# Patient Record
Sex: Male | Born: 1980
Health system: Southern US, Community
[De-identification: ages and names within clinical notes are randomized; demographics above are authoritative.]

## PROBLEM LIST (undated history)

## (undated) DIAGNOSIS — M7712 Lateral epicondylitis, left elbow: Secondary | ICD-10-CM

## (undated) DIAGNOSIS — K219 Gastro-esophageal reflux disease without esophagitis: Secondary | ICD-10-CM

## (undated) HISTORY — DX: Lateral epicondylitis, left elbow: M77.12

## (undated) HISTORY — DX: Gastro-esophageal reflux disease without esophagitis: K21.9

---

## 2018-05-23 ENCOUNTER — Ambulatory Visit: Payer: Self-pay | Admitting: Sports Medicine

## 2018-05-25 ENCOUNTER — Ambulatory Visit: Payer: BLUE CROSS/BLUE SHIELD | Admitting: Sports Medicine

## 2018-05-25 ENCOUNTER — Ambulatory Visit: Payer: Self-pay

## 2018-05-25 VITALS — BP 122/78 | Ht 75.0 in | Wt 195.0 lb

## 2018-05-25 DIAGNOSIS — M7712 Lateral epicondylitis, left elbow: Secondary | ICD-10-CM | POA: Diagnosis not present

## 2018-05-25 DIAGNOSIS — M25522 Pain in left elbow: Secondary | ICD-10-CM

## 2018-05-26 ENCOUNTER — Encounter: Payer: Self-pay | Admitting: Sports Medicine

## 2018-05-26 NOTE — Progress Notes (Signed)
   Subjective:    Patient ID: Richard Campos, male    DOB: [birth year], 38 y.o.   MRN: [id number]  HPI chief complaint: Left elbow pain  Very pleasant 38 year old right-hand-dominant male comes in today complaining of left elbow pain that started several months ago.  He is very active and enjoys lifting weights and playing tennis.  He believes his pain began while utilizing a two-handed backhand in tennis.  He does not recall any specific injury but began to develop lateral elbow pain that eventually developed into more diffuse pain involving the entire elbow.  At that time, he was living in [place] and he saw his PCP there.  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  This actually made his pain worse.  He return to his PCP for a cortisone injection which was minimally helpful.  Since that time, he has relocated to [place].  He has returned to lifting weights and has actually noticed his elbow pain starting to improve.  Today he localizes all of his pain to the lateral elbow.  He has not noticed any swelling.  He denies numbness or tingling.  No prior elbow surgeries.  No imaging to date.  Past medical history reviewed Medications reviewed Allergies reviewed Works as an Pensions consultant   Review of Systems    As above Objective:   Physical Exam  Well-developed, well-nourished.  No acute distress.  Awake alert and oriented x3.  Vital signs reviewed.  Left elbow: Full range of motion.  No effusion.  No soft tissue swelling.  Patient is tender to palpation over the lateral epicondyle with reproducible pain with ECRB testing.  No tenderness over the medial epicondyle.  No tenderness over the biceps tendon.  Good strength.  Good pulses.   Left elbow MSK ultrasound: Limited images were obtained.  Longitudinal view shows a hypoechoic area in the deep fibers in the midportion of the common extensor tendon.  No joint effusion.  No spurring appreciated.   Findings consistent with common extensor tendinopathy with small partial thickness tear of the deep fibers.      Assessment & Plan:   Left elbow pain secondary to common extensor tendinopathy/partial tearing  Patient is given a home exercise program consisting of stretching and eccentric strengthening exercises.  Since his symptoms are already starting to improve on their own I am going to hold off on formal physical therapy at this time.  However, patient will return to the office in 4 weeks for reevaluation.  If symptoms do not continue to improve then I would reconsider merits of formal physical therapy.  I did explain that we do not like to repeat cortisone injections for this condition.  Future options could include PRP versus surgical consultation for common extensor tendon debridement.  This would require a preoperative MRI. Of note, patient is an avid Armed forces operational officer but I would like for him to refrain from tennis until follow-up.  He may continue to lift weights using pain as his guide.  As he is new to the area and enjoys tennis I will be sure to introduce him to Dr.Fields at his follow-up visit.

## 2018-06-21 ENCOUNTER — Ambulatory Visit: Payer: BLUE CROSS/BLUE SHIELD | Admitting: Sports Medicine

## 2018-06-21 VITALS — BP 104/68 | Ht 75.0 in | Wt 195.0 lb

## 2018-06-21 DIAGNOSIS — M7712 Lateral epicondylitis, left elbow: Secondary | ICD-10-CM

## 2018-06-22 NOTE — Progress Notes (Signed)
   Subjective:    Patient ID: Richard Campos, male    DOB: 09/23/1980, 38 y.o.   MRN: [id number]  HPI   Patient comes in today for follow-up on left elbow lateral epicondylitis.  Unfortunately, he is still struggling with pain despite a home exercise program.  He has not returned to playing tennis.  He still localizes pain directly over the lateral epicondyle.  Most noticeable with lifting heavy objects with his left hand.  He has not noticed any swelling.  No numbness or tingling.  MSK ultrasound done at the last visit showed hypoechoic changes of the deep fibers of the midportion of the common extensor tendon concerning for possible partial-thickness tear here.    Review of Systems    As above Objective:   Physical Exam  Well-developed, well-nourished.  No acute distress.  Awake alert and oriented x3.  Vital signs reviewed  Left elbow: Full range of motion.  No effusion.  No soft tissue swelling.  Patient remains tender to palpation directly over the lateral epicondyle with reproducible pain with ECRB testing.  No tenderness over the medial epicondyle.  No tenderness over the biceps tendon.  Good strength.  Good pulses.       Assessment & Plan:   Chronic left elbow pain secondary to lateral epicondylitis with ultrasound evidence of probable partial-thickness tear  Patient has had symptoms for several months that have been unresponsive to conservative treatment including oral prednisone, steroid injections, and a home exercise program.  I would like to get an MRI to further evaluate the degree of tearing of the common extensor tendon.  Patient may be a candidate for surgical debridement or repair.  Patient is in agreement with this plan.  He would also like to go ahead and start formal physical therapy while we wait on the MRI to get done.  Phone follow-up with MRI results when available.  We will delineate further treatment based on those findings.

## 2018-06-24 ENCOUNTER — Ambulatory Visit
Admission: RE | Admit: 2018-06-24 | Discharge: 2018-06-24 | Disposition: A | Discharge status: Home / Self Care | Payer: BLUE CROSS/BLUE SHIELD | Source: Ambulatory Visit | Attending: Sports Medicine | Admitting: Sports Medicine

## 2018-06-24 DIAGNOSIS — M7712 Lateral epicondylitis, left elbow: Secondary | ICD-10-CM | POA: Diagnosis not present

## 2018-07-01 ENCOUNTER — Ambulatory Visit
Admission: RE | Admit: 2018-07-01 | Discharge: 2018-07-01 | Disposition: A | Discharge status: Home / Self Care | Payer: BLUE CROSS/BLUE SHIELD | Source: Ambulatory Visit | Attending: Sports Medicine | Admitting: Sports Medicine

## 2018-07-01 DIAGNOSIS — S56512A Strain of other extensor muscle, fascia and tendon at forearm level, left arm, initial encounter: Secondary | ICD-10-CM | POA: Diagnosis not present

## 2018-07-01 DIAGNOSIS — M7712 Lateral epicondylitis, left elbow: Secondary | ICD-10-CM

## 2018-07-06 ENCOUNTER — Telehealth: Payer: Self-pay | Admitting: Sports Medicine

## 2018-07-06 NOTE — Telephone Encounter (Signed)
  I spoke with Richard Campos on the phone today after reviewing MRI findings of his left elbow.  MRI confirms what was seen on ultrasound.  He has moderate tendinosis of the common extensor tendon origin with a small interstitial tear. No other abnormalities.  Based on these findings, I recommended that he go ahead and start physical therapy and follow-up with me 4 weeks later.  I reassured him that this typically improves with time but if he continues to struggle after physical therapy then we could consider referral to Dr. Amanda Pea for his input.  Patient is in agreement with this plan.

## 2018-07-13 ENCOUNTER — Ambulatory Visit: Payer: BLUE CROSS/BLUE SHIELD | Attending: Sports Medicine | Admitting: Physical Therapy

## 2018-07-13 ENCOUNTER — Encounter: Payer: Self-pay | Admitting: Physical Therapy

## 2018-07-13 ENCOUNTER — Other Ambulatory Visit: Payer: Self-pay

## 2018-07-13 DIAGNOSIS — M25522 Pain in left elbow: Secondary | ICD-10-CM | POA: Insufficient documentation

## 2018-07-13 DIAGNOSIS — M6281 Muscle weakness (generalized): Secondary | ICD-10-CM | POA: Diagnosis not present

## 2018-07-13 DIAGNOSIS — M25622 Stiffness of left elbow, not elsewhere classified: Secondary | ICD-10-CM | POA: Diagnosis not present

## 2018-07-13 DIAGNOSIS — M62838 Other muscle spasm: Secondary | ICD-10-CM

## 2018-07-13 NOTE — Patient Instructions (Addendum)

## 2018-07-13 NOTE — Addendum Note (Signed)
Addended by: [name] on: 07/13/2018 09:55 AM   Modules accepted: Orders

## 2018-07-13 NOTE — Therapy (Signed)
[facility] [address]  [address] [place], [place], [address] Phone: [phone]   Fax:  [phone]  Physical Therapy Evaluation  Patient Details  Name: Richard Campos MRN: [id number] Date of Birth: 1980/06/28 Referring Provider (PT): Reino Bellis, MD   Encounter Date: 07/13/2018  PT End of Session - 07/13/18 0918    Visit Number  1    Number of Visits  8    Date for PT Re-Evaluation  08/10/18    Authorization Type  BCBS    PT Start Time  0805    PT Stop Time  0851    PT Time Calculation (min)  46 min    Activity Tolerance  Patient tolerated treatment well    Behavior During Therapy  [facility] for tasks assessed/performed       History reviewed. No pertinent past medical history.  History reviewed. No pertinent surgical history.  There were no vitals filed for this visit.   Subjective Assessment - 07/13/18 0808    Subjective  Pt reports started noting issues with elbow late summer (~Sept 2019). PCP recommended rest (stop working out & playing tennis), but pain got worse. Ended up with injections with minimal relief. After moving to [place] tried goind back to gym with some improvement, but continued to avoid tennis. Saw ortho MD and was presctribed HEP, but continues to have lingereing issues. [place] & MRI reveal small tear.    Patient Stated Goals  "get back on the tennis court w/o hurting"    Currently in Pain?  No/denies    Pain Score  0-No pain   up to 3-4/10 typically with certain movements   Pain Location  Elbow    Pain Orientation  Left;Lateral    Pain Descriptors / Indicators  Sharp;Tightness    Pain Type  Chronic pain    Pain Radiating Towards  n/a    Pain Onset  More than a month ago    Pain Frequency  Intermittent    Aggravating Factors   wrist extension, resisted middle finger extension, wide grip    Pain Relieving Factors  stretching, minimal pain relief from icing    Effect of Pain on Daily Activities  limits  lifting and carrying things in L hand         [facility] PT Assessment - 07/13/18 0805      Assessment   Medical Diagnosis  L lateral epicondylitis    Referring Provider (PT)  Reino Bellis, MD    Onset Date/Surgical Date  --   ~Sept 2019   Hand Dominance  Right    Next MD Visit  PRN    Prior Therapy  remote h/o PT of Achilles tendonitis      Home Environment   Living Environment  Private residence      Prior Function   Level of Independence  Independent    Vocation  Full time employment    Vocation Requirements  desk job    Leisure  working out - lifting, running, stationary bike; tennis      Cognition   Overall Cognitive Status  Within Functional Limits for tasks assessed      Observation/Other Assessments   Focus on Therapeutic Outcomes (FOTO)   Elbow - 63% (37% limitation); Predicted 73% (27% limitation)      ROM / Strength   AROM / PROM / Strength  AROM;Strength      AROM   Overall AROM   Within functional limits for tasks  performed    AROM Assessment Site  Elbow;Forearm;Wrist      Strength   Strength Assessment Site  Elbow;Forearm;Wrist;Hand    Right/Left Elbow  Right;Left    Right Elbow Flexion  5/5    Right Elbow Extension  5/5    Left Elbow Flexion  4+/5    Left Elbow Extension  4+/5    Right/Left Forearm  Right;Left    Right Forearm Pronation  5/5    Right Forearm Supination  5/5    Left Forearm Pronation  4+/5   slight pain   Left Forearm Supination  4+/5   slight pain   Right/Left Wrist  Right;Left    Right Wrist Flexion  5/5    Right Wrist Extension  5/5    Right Wrist Radial Deviation  5/5    Right Wrist Ulnar Deviation  5/5    Left Wrist Flexion  4+/5   slight pain   Left Wrist Extension  4+/5    Left Wrist Radial Deviation  4+/5    Left Wrist Ulnar Deviation  4+/5   slight discomfort   Right/Left hand  Right;Left    Right Hand Gross Grasp  Functional    Right Hand Grip (lbs)  92.67   89, 94, 95   Right Hand Lateral Pinch  20.33 lbs   21,  20, 20   Right Hand 3 Point Pinch  17 lbs   16, 17, 18   Left Hand Grip (lbs)  70   79, 71, 60   Left Hand Lateral Pinch  17 lbs   17, 17, 17   Left Hand 3 Point Pinch  15.67 lbs   15, 15, 16     Palpation   Palpation comment  taut bands t/o L wrist extensor group with mild ttp                Objective measurements completed on examination: See above findings.      [facility] Adult PT Treatment/Exercise - 07/13/18 0805      Self-Care   Self-Care  Heat/Ice Application    Heat/Ice Application  Instructed pt in ice massage for lateral epicondylitis.      Exercises   Exercises  Elbow;Wrist      Elbow Exercises   Forearm Supination  Left;5 reps;Seated    Forearm Supination Limitations  hammer (held at end) - focus on slow eccentric control    Forearm Pronation  Left;5 reps;Seated    Forearm Pronation Limitations  hammer (held at end) - focus on slow eccentric control      Wrist Exercises   Wrist Flexion  Left;10 reps;Bar weights/barbell;Strengthening;Seated    Bar Weights/Barbell (Wrist Flexion)  5 lbs    Wrist Extension  Left;10 reps;Bar weights/barbell;Strengthening;Seated    Bar Weights/Barbell (Wrist Extension)  5 lbs    Wrist Extension Limitations  AAROM concentric with focus on slow eccentric control    Other wrist exercises  L wrist flexion stretch x 30 sec    Other wrist exercises  L wrist exetnsion stretch with slight ulnar deviation x 30 sec             PT Education - 07/13/18 0850    Education Details  PT eval findings, anticipated POC, review of MD prescribed HEP with minor modifications (esp emphasis on eccentric wrist extension), instructions in ice massage & info on dry needling    Person(s) Educated  Patient    Methods  Explanation;Demonstration;Handout    Comprehension  Verbalized understanding;Returned demonstration;Need  further instruction          PT Long Term Goals - 07/13/18 0851      PT LONG TERM GOAL #1   Title  Independent with  ongoing/advanced HEP    Status  New    Target Date  08/10/18      PT LONG TERM GOAL #2   Title  L grip strength within 10# of R w/o increased pain    Status  New    Target Date  08/10/18      PT LONG TERM GOAL #3   Title  Patient will report ability to use L arm functionally with daily ADLs and task including lifting & carrying items w/o pain provocation    Status  New    Target Date  08/10/18      PT LONG TERM GOAL #4   Title  Patient will report ability to return to playing tennis w/o increased pain at L elbow    Status  New    Target Date  08/10/18             Plan - 07/13/18 0851    Clinical Impression Statement  Karlito is a 38 y/o R handed male who presents to OP PT for L lateral epicondylitis originating ~6 months ago in late summer 2019. Pt attributes onset of pain to playing tennis with 2 handed swing. Pain currently 0/10 at rest, but pt reports pain up to 3-4/10 with activities requiring wrist extension or wide handed grip and lifting. Pt mildly ttp over L lateral epicondyle and wrist extensor muscle group, with taut bands palpable t/o L wrist extensor group. L wrist and elbow ROM essentially WNL with mild tightness reported at end ranges with most motions of L forearm and wrist. Very mild weakness noted in L elbow, forearm and wrist with >20# grip strength deficit L vs R. Pain limits lifting and carrying things in L hand and keeps him from returning to playing tennis. Pt will benefit from skilled PT to address deficits listed to reduce pain and allow for improved functional L UE use with daily activities as well as allow him to return to playing tennis.    Clinical Presentation  Stable    Clinical Decision Making  Low    Rehab Potential  Good    PT Frequency  2x / week    PT Duration  4 weeks    PT Treatment/Interventions  Patient/family education;Therapeutic exercise;Therapeutic activities;Manual techniques;Dry needling;Passive range of motion;Taping;Ultrasound;Electrical  Stimulation;Moist Heat;Cryotherapy;Iontophoresis 4mg /ml Dexamethasone;ADLs/Self Care Home Management    Consulted and Agree with Plan of Care  Patient       Patient will benefit from skilled therapeutic intervention in order to improve the following deficits and impairments:  Pain, Impaired flexibility, Increased muscle spasms, Increased fascial restricitons, Decreased strength, Impaired UE functional use, Decreased activity tolerance  Visit Diagnosis: Pain in left elbow  Stiffness of left elbow, not elsewhere classified  Other muscle spasm  Muscle weakness (generalized)     Problem List There are no active problems to display for this patient.   [name], PT, MPT 07/13/2018, 9:48 AM  [facility] [address]  [address] [place], [place], [address] Phone: [phone]   Fax:  [phone]  Name: Richard Campos MRN: [id number] Date of Birth: October 25, 1980

## 2018-07-19 ENCOUNTER — Ambulatory Visit: Payer: BLUE CROSS/BLUE SHIELD | Attending: Sports Medicine | Admitting: Physical Therapy

## 2018-07-19 ENCOUNTER — Encounter: Payer: Self-pay | Admitting: Physical Therapy

## 2018-07-19 DIAGNOSIS — M62838 Other muscle spasm: Secondary | ICD-10-CM | POA: Diagnosis not present

## 2018-07-19 DIAGNOSIS — M25622 Stiffness of left elbow, not elsewhere classified: Secondary | ICD-10-CM

## 2018-07-19 DIAGNOSIS — M25522 Pain in left elbow: Secondary | ICD-10-CM | POA: Insufficient documentation

## 2018-07-19 DIAGNOSIS — M6281 Muscle weakness (generalized): Secondary | ICD-10-CM | POA: Insufficient documentation

## 2018-07-19 NOTE — Therapy (Signed)
[facility] [address]  [address] [place], [place], [address] Phone: [phone]   Fax:  [phone]  Physical Therapy Treatment  Patient Details  Name: Richard Campos MRN: [id number] Date of Birth: 1980-11-01 Referring Provider (PT): Reino Bellis, MD   Encounter Date: 07/19/2018  PT End of Session - 07/19/18 1703    Visit Number  2    Number of Visits  8    Date for PT Re-Evaluation  08/10/18    Authorization Type  BCBS    PT Start Time  1703    PT Stop Time  1745    PT Time Calculation (min)  42 min    Activity Tolerance  Patient tolerated treatment well    Behavior During Therapy  [facility] for tasks assessed/performed       History reviewed. No pertinent past medical history.  History reviewed. No pertinent surgical history.  There were no vitals filed for this visit.  Subjective Assessment - 07/19/18 1706    Subjective  Pt reports has been completing HEP as recommended at eval and feels changes have made a definite difference in the feel of the exercises - feels like exercises are better challenging him and he has dropped back on the weights. Also performing ice massage after completing HEP.    Patient Stated Goals  "get back on the tennis court w/o hurting"    Currently in Pain?  No/denies    Pain Onset  More than a month ago                       [facility] Adult PT Treatment/Exercise - 07/19/18 1703      Self-Care   Self-Care  Posture    Posture  Discussed proper keyboard height and wrist support for computer ues at work.      Exercises   Exercises  Elbow;Wrist      Shoulder Exercises: ROM/Strengthening   UBE (Upper Arm Bike)  L2.5 x 6 min (3' fwd/3' back)      Wrist Exercises   Other wrist exercises  B wrist extension roll up with red TB on PVC pipe x 3     Other wrist exercises  L wrist extension stretch with slight ulnar deviation x 30 sec      Modalities   Modalities  Iontophoresis      Iontophoresis   Type of Iontophoresis  Dexamethasone    Location  L lateral epicondyle/wrist extensor group    Dose  80 mA-min, 1.0 mL    Time  4-6 hr patch (#1 of 6)      Manual Therapy   Manual Therapy  Soft tissue mobilization;Myofascial release    Soft tissue mobilization  STM & cross-friction massage to L wrist extensor group    Myofascial Release  pin & stretch to L wrist extensors       Trigger Point Dry Needling - 07/19/18 1703    Consent Given?  Yes    Education Handout Provided  Yes    Muscles Treated Wrist/Hand  Extensor carpi radialis longus/brevis;Extensor digitorum   Left   Extensor carpi radialis longus/brevis Response  Twitch response elicited;Palpable increased muscle length    Extensor digitorum Response  Twitch response elicited;Palpable increased muscle length           PT Education - 07/19/18 1703    Education Details  Review of role of DN, expected response to treatment and post-treatment recommended activity level; Iontophoresis  patch instructions    Person(s) Educated  Patient    Methods  Explanation;Handout    Comprehension  Verbalized understanding          PT Long Term Goals - 07/19/18 1706      PT LONG TERM GOAL #1   Title  Independent with ongoing/advanced HEP    Status  On-going      PT LONG TERM GOAL #2   Title  L grip strength within 10# of R w/o increased pain    Status  On-going      PT LONG TERM GOAL #3   Title  Patient will report ability to use L arm functionally with daily ADLs and task including lifting & carrying items w/o pain provocation    Status  On-going      PT LONG TERM GOAL #4   Title  Patient will report ability to return to playing tennis w/o increased pain at L elbow    Status  On-going            Plan - 07/19/18 1706    Clinical Impression Statement  Loistine Chance reporting recommended adjustments to HEP from eval (slow pace with focus on eccentric control) are making the exercises more beneficial. Also notes  he has been following the exercises with the ice massage to help with inflammation and ongoing pain. Initiated manual therapy including DN upon informed pt consent with good twitch responses elicited and palpable reduction in muscle tension and taut bands. Inital ionto patch also applied to promote further reduction in inflammation & irritation.    Rehab Potential  Good    PT Treatment/Interventions  Patient/family education;Therapeutic exercise;Therapeutic activities;Manual techniques;Dry needling;Passive range of motion;Taping;Ultrasound;Electrical Stimulation;Moist Heat;Cryotherapy;Iontophoresis 4mg /ml Dexamethasone;ADLs/Self Care Home Management    PT Next Visit Plan  Assess response to DN & ionto patch; Manual therapy including DN as indicated to L wrist extensor group; Flexibility and eccentirc focused strengthening; Modalities including ionto, [place], estim or ice massage as indicated    Consulted and Agree with Plan of Care  Patient       Patient will benefit from skilled therapeutic intervention in order to improve the following deficits and impairments:  Pain, Impaired flexibility, Increased muscle spasms, Increased fascial restricitons, Decreased strength, Impaired UE functional use, Decreased activity tolerance  Visit Diagnosis: Pain in left elbow  Stiffness of left elbow, not elsewhere classified  Other muscle spasm  Muscle weakness (generalized)     Problem List There are no active problems to display for this patient.   Richard Campos, PT, MPT 07/19/2018, 6:20 PM  [facility] [address]  [address] [place], [place], [address] Phone: [phone]   Fax:  [phone]  Name: Richard Campos MRN: [id number] Date of Birth: [birth year]

## 2018-07-19 NOTE — Patient Instructions (Signed)
IONTOPHORESIS PATIENT PRECAUTIONS & CONTRAINDICATIONS:  . Redness under one or both electrodes can occur.  This characterized by a uniform redness that usually disappears within 12 hours of treatment. . Small pinhead size blisters may result in response to the drug.  Contact your physician if the problem persists more than 24 hours. . On rare occasions, iontophoresis therapy can result in temporary skin reactions such as rash, inflammation, irritation or burns.  The skin reactions may be the result of individual sensitivity to the ionic solution used, the condition of the skin at the start of treatment, reaction to the materials in the electrodes, allergies or sensitivity to dexamethasone, or a poor connection between the patch and your skin.  Discontinue using iontophoresis if you have any of these reactions and report to your therapist. . Remove the Patch or electrodes if you have any undue sensation of pain or burning during the treatment and report discomfort to your therapist. . Tell your Therapist if you have had known adverse reactions to the application of electrical current. . Approximate treatment time is 4-6 hours.  Remove the patch after 6 hours. . The Patch can be worn during normal activity, however excessive motion where the electrodes have been placed can cause poor contact between the skin and the electrode or uneven electrical current resulting in greater risk of skin irritation. . Keep out of the reach of children.   . DO NOT use if you have a cardiac pacemaker or any other electrically sensitive implanted device. . DO NOT use if you have a known sensitivity to dexamethasone. . DO NOT use during Magnetic Resonance Imaging (MRI). . DO NOT use over broken or compromised skin (e.g. sunburn, cuts, or acne) due to the increased risk of skin reaction. . DO NOT SHAVE over the area to be treated:  To establish good contact between the Patch and the skin, excessive hair may be  clipped. . DO NOT place the Patch or electrodes on or over your eyes, directly over your heart, or brain. . DO NOT reuse the Patch or electrodes as this may cause burns to occur.  

## 2018-07-21 ENCOUNTER — Ambulatory Visit: Payer: BLUE CROSS/BLUE SHIELD

## 2018-07-21 DIAGNOSIS — M25522 Pain in left elbow: Secondary | ICD-10-CM

## 2018-07-21 DIAGNOSIS — M25622 Stiffness of left elbow, not elsewhere classified: Secondary | ICD-10-CM | POA: Diagnosis not present

## 2018-07-21 DIAGNOSIS — M62838 Other muscle spasm: Secondary | ICD-10-CM | POA: Diagnosis not present

## 2018-07-21 DIAGNOSIS — M6281 Muscle weakness (generalized): Secondary | ICD-10-CM

## 2018-07-21 NOTE — Therapy (Signed)
[facility] [address]  [address] [place], [place], [address] Phone: [phone]   Fax:  [phone]  Physical Therapy Treatment  Patient Details  Name: Richard Campos MRN: [id number] Date of Birth: 07/24/1980 Referring Provider (PT): Reino Bellis, MD   Encounter Date: 07/21/2018  PT End of Session - 07/21/18 0855    Visit Number  3    Number of Visits  8    Date for PT Re-Evaluation  08/10/18    Authorization Type  BCBS    PT Start Time  0848    PT Stop Time  0934    PT Time Calculation (min)  46 min    Activity Tolerance  Patient tolerated treatment well    Behavior During Therapy  [facility] for tasks assessed/performed       No past medical history on file.  No past surgical history on file.  There were no vitals filed for this visit.  Subjective Assessment - 07/21/18 0852    Subjective  Pt. reporting he had some dull soreness after dry needling last session however this may be also attributes to hitting his L elbow on door yesterday.      Patient Stated Goals  "get back on the tennis court w/o hurting"    Currently in Pain?  No/denies    Pain Score  0-No pain   up to 5/10 with pressure in L elbow   Pain Location  Elbow    Pain Orientation  Left;Lateral    Pain Descriptors / Indicators  Sore;Tightness    Pain Type  Chronic pain    Pain Onset  More than a month ago    Pain Frequency  Intermittent    Multiple Pain Sites  No                       [facility] Adult PT Treatment/Exercise - 07/21/18 0908      Shoulder Exercises: ROM/Strengthening   UBE (Upper Arm Bike)  L2.5 x 6 min (3' fwd/3' back)      Hand Exercises   Other Hand Exercises  L F990 grip squeeze yellow spring (#20) 2 x 15 reps; focusing on 3" eccentric release       Wrist Exercises   Wrist Flexion  Left;Strengthening;Seated;Bar weights/barbell   2 x 12 reps    Bar Weights/Barbell (Wrist Flexion)  5 lbs    Wrist Flexion Limitations   AAROM concentric with focus on slow eccentric     Wrist Extension  Left;10 reps;Strengthening;Seated;Bar weights/barbell   2 x 10 reps    Bar Weights/Barbell (Wrist Extension)  5 lbs    Wrist Extension Limitations  AAROM concentric with focus on slow eccentric     Other wrist exercises  L wrist extension stretch with slight ulnar deviation 3 x 30 sec      Modalities   Modalities  Ultrasound      Ultrasound   Ultrasound Location  L forearm extensor bundle     Ultrasound Parameters  3.3MHz, 0.8 Watts/cm2, 8'    Ultrasound Goals  Pain      Manual Therapy   Manual Therapy  Soft tissue mobilization;Myofascial release    Manual therapy comments  supine     Soft tissue mobilization  STM & cross-friction massage to L wrist extensor group   avoiding reported bruised area from pt. hitting arm on door             PT Education -  07/21/18 1004    Education Details  HEP update; eccentric focused wrist extension, flexion curl with dumbbell 5# at home, wrist extension stretch with ulnar deviation    Person(s) Educated  Patient    Methods  Explanation;Verbal cues;Handout;Demonstration    Comprehension  Verbalized understanding;Returned demonstration;Verbal cues required;Need further instruction          PT Long Term Goals - 07/19/18 1706      PT LONG TERM GOAL #1   Title  Independent with ongoing/advanced HEP    Status  On-going      PT LONG TERM GOAL #2   Title  L grip strength within 10# of R w/o increased pain    Status  On-going      PT LONG TERM GOAL #3   Title  Patient will report ability to use L arm functionally with daily ADLs and task including lifting & carrying items w/o pain provocation    Status  On-going      PT LONG TERM GOAL #4   Title  Patient will report ability to return to playing tennis w/o increased pain at L elbow    Status  On-going            Plan - 07/21/18 0858    Clinical Impression Statement  Pt. reporting he has had L elbow soreness  after DN last session however also may be attributed to hitting his L elbow on door yesterday.  Unsure of response to ionto patch applied last session.  Session focusing on progression of wrist flexion/extension eccentric strengthening activities with addition of grip strengthening which was well tolerated without pain increase.  Pt. reporting L elbow only hurting with pressure today.  Trialed ultrasound at 50% pulsed to L forearm extensor bundle today which was tolerated well.  Pt. reporting he is performing ice cup massage to L elbow nightly.  Will monitor responses to today's session in upcoming visit and continue to progress.      Rehab Potential  Good    PT Treatment/Interventions  Patient/family education;Therapeutic exercise;Therapeutic activities;Manual techniques;Dry needling;Passive range of motion;Taping;Ultrasound;Electrical Stimulation;Moist Heat;Cryotherapy;Iontophoresis 4mg /ml Dexamethasone;ADLs/Self Care Home Management    PT Next Visit Plan  Monitor response to eccentric strengthening progression and ultrasound; Manual therapy including DN as indicated to L wrist extensor group; Flexibility and eccentirc focused strengthening; Modalities including ionto, [place], estim or ice massage as indicated    Consulted and Agree with Plan of Care  Patient       Patient will benefit from skilled therapeutic intervention in order to improve the following deficits and impairments:  Pain, Impaired flexibility, Increased muscle spasms, Increased fascial restricitons, Decreased strength, Impaired UE functional use, Decreased activity tolerance  Visit Diagnosis: Pain in left elbow  Stiffness of left elbow, not elsewhere classified  Other muscle spasm  Muscle weakness (generalized)     Problem List There are no active problems to display for this patient.   Richard Campos, PTA 07/21/18 10:05 AM   [facility] [address]  [address] [place], [place], [address] Phone: [phone]   Fax:  [phone]  Name: Richard Campos MRN: [id number] Date of Birth: 21-Dec-1980

## 2018-07-25 ENCOUNTER — Encounter: Payer: Self-pay | Admitting: Physical Therapy

## 2018-07-25 ENCOUNTER — Ambulatory Visit: Payer: BLUE CROSS/BLUE SHIELD | Admitting: Physical Therapy

## 2018-07-25 DIAGNOSIS — M6281 Muscle weakness (generalized): Secondary | ICD-10-CM

## 2018-07-25 DIAGNOSIS — M62838 Other muscle spasm: Secondary | ICD-10-CM | POA: Diagnosis not present

## 2018-07-25 DIAGNOSIS — M25522 Pain in left elbow: Secondary | ICD-10-CM | POA: Diagnosis not present

## 2018-07-25 DIAGNOSIS — M25622 Stiffness of left elbow, not elsewhere classified: Secondary | ICD-10-CM | POA: Diagnosis not present

## 2018-07-25 NOTE — Therapy (Signed)
[facility] [address]  [address] [place], [place], [address] Phone: [phone]   Fax:  [phone]  Physical Therapy Treatment  Patient Details  Name: Richard Campos MRN: [id number] Date of Birth: 1981-01-25 Referring Provider (PT): Reino Bellis, MD   Encounter Date: 07/25/2018  PT End of Session - 07/25/18 1704    Visit Number  4    Number of Visits  8    Date for PT Re-Evaluation  08/10/18    Authorization Type  BCBS    PT Start Time  1704    PT Stop Time  1747    PT Time Calculation (min)  43 min    Activity Tolerance  Patient tolerated treatment well    Behavior During Therapy  [facility] for tasks assessed/performed       History reviewed. No pertinent past medical history.  History reviewed. No pertinent surgical history.  There were no vitals filed for this visit.  Subjective Assessment - 07/25/18 1706    Subjective  Pt still feeling like he is having some bruising related soreness from when he hit his arm against the doorframe last week, but feels that this is different from his intial pain which seems to be improving.    Patient Stated Goals  "get back on the tennis court w/o hurting"    Currently in Pain?  No/denies    Pain Onset  More than a month ago                       [facility] Adult PT Treatment/Exercise - 07/25/18 1704      Elbow Exercises   Forearm Supination  Left;15 reps;Seated;Strengthening    Forearm Supination Limitations  golf club held at distal end of grip    Forearm Pronation  Left;15 reps;Seated;Strengthening    Forearm Pronation Limitations  golf club held at distal end of grip      Shoulder Exercises: Standing   Diagonals  Left;15 reps;Theraband;Strengthening    Theraband Level (Shoulder Diagonals)  Level 3 (Green)    Diagonals Limitations  D1/D2 flexion & extension - emhasizing wrist & forearm motions      Shoulder Exercises: ROM/Strengthening   UBE (Upper Arm Bike)   L3.0 x 6 min (3' fwd/3' back)      Hand Exercises   Digit Composite ABduction  Left;10 reps;Theraband;Strengthening    Digit Composite ABduction Limitations  yellow TB    Other Hand Exercises  L Stovall grip 50# (yellow spring) x15 focusing on slow eccentric release      Wrist Exercises   Other wrist exercises  B wrist extension roll up with green TB on PVC pipe x 3      Manual Therapy   Manual Therapy  Taping    Kinesiotex  Inhibit Muscle      Kinesiotix   Inhibit Muscle   L lateral epicondylitis - 3 strips - 2 strips 30-50% wrist to triceps & forearm to triceps, 70% perpendicular over area of greatest tenderness             PT Education - 07/25/18 1745    Education Details  Kinesotape wearing instructions; Yellow TB provided for composite digit extension/abduction at home    Person(s) Educated  Patient    Methods  Explanation;Demonstration;Handout    Comprehension  Verbalized understanding;Returned demonstration          PT Long Term Goals - 07/19/18 1706      PT LONG  TERM GOAL #1   Title  Independent with ongoing/advanced HEP    Status  On-going      PT LONG TERM GOAL #2   Title  L grip strength within 10# of R w/o increased pain    Status  On-going      PT LONG TERM GOAL #3   Title  Patient will report ability to use L arm functionally with daily ADLs and task including lifting & carrying items w/o pain provocation    Status  On-going      PT LONG TERM GOAL #4   Title  Patient will report ability to return to playing tennis w/o increased pain at L elbow    Status  On-going            Plan - 07/25/18 1709    Clinical Impression Statement  Richard Campos reporting bruising from impact with doorway still causing some soreness but he feels like pain that brought im to PT is improving. Slight faded yellow bruise still evident over wrist extensor group with some ttp but significantly reduced muscle tension/taut bands. Good tolerance for progression of strengthening  exercises including adding multidimensional movements with UE PNF patterns. Initiated trial of taping today to promote decreased bruising and further decrease wrist extensor muscle tension & will assess response on next visit.    Rehab Potential  Good    PT Treatment/Interventions  Patient/family education;Therapeutic exercise;Therapeutic activities;Manual techniques;Dry needling;Passive range of motion;Taping;Ultrasound;Electrical Stimulation;Moist Heat;Cryotherapy;Iontophoresis 4mg /ml Dexamethasone;ADLs/Self Care Home Management    PT Next Visit Plan  Assess response to taping; Manual therapy including DN as indicated to L wrist extensor group; Flexibility and eccentirc focused strengthening; Modalities including ionto, [place], estim or ice massage as indicated    Consulted and Agree with Plan of Care  Patient       Patient will benefit from skilled therapeutic intervention in order to improve the following deficits and impairments:  Pain, Impaired flexibility, Increased muscle spasms, Increased fascial restricitons, Decreased strength, Impaired UE functional use, Decreased activity tolerance  Visit Diagnosis: Pain in left elbow  Stiffness of left elbow, not elsewhere classified  Other muscle spasm  Muscle weakness (generalized)     Problem List There are no active problems to display for this patient.   [name], PT, MPT 07/25/2018, 6:17 PM  [facility] [address]  [address] [place], [place], [address] Phone: [phone]   Fax:  [phone]  Name: Richard Campos MRN: [id number] Date of Birth: 10/03/1980

## 2018-07-28 ENCOUNTER — Ambulatory Visit: Payer: BLUE CROSS/BLUE SHIELD

## 2018-07-28 ENCOUNTER — Other Ambulatory Visit: Payer: Self-pay

## 2018-07-28 DIAGNOSIS — M25522 Pain in left elbow: Secondary | ICD-10-CM

## 2018-07-28 DIAGNOSIS — M62838 Other muscle spasm: Secondary | ICD-10-CM | POA: Diagnosis not present

## 2018-07-28 DIAGNOSIS — M6281 Muscle weakness (generalized): Secondary | ICD-10-CM | POA: Diagnosis not present

## 2018-07-28 DIAGNOSIS — M25622 Stiffness of left elbow, not elsewhere classified: Secondary | ICD-10-CM | POA: Diagnosis not present

## 2018-07-28 NOTE — Therapy (Signed)
[facility] Outpatient Rehabilitation [facility] [address]  [address] [place], [place], [address] Phone: [phone]   Fax:  [phone]  Physical Therapy Treatment  Patient Details  Name: Richard Campos MRN: [id number] Date of Birth: Feb 03, 1981 Referring Provider (PT): Reino Bellis, MD   Encounter Date: 07/28/2018  PT End of Session - 07/28/18 1144    Visit Number  5    Number of Visits  8    Date for PT Re-Evaluation  08/10/18    Authorization Type  BCBS    PT Start Time  0847    PT Stop Time  0931    PT Time Calculation (min)  44 min    Activity Tolerance  Patient tolerated treatment well    Behavior During Therapy  [facility] for tasks assessed/performed       No past medical history on file.  No past surgical history on file.  There were no vitals filed for this visit.  Subjective Assessment - 07/28/18 1142    Subjective  Pt. reporting some benefit from taping applied last session.      Patient Stated Goals  "get back on the tennis court w/o hurting"    Currently in Pain?  No/denies    Pain Score  0-No pain   Pain rising to 4/10 at worst with certain movements    Pain Location  Elbow    Pain Orientation  Left;Lateral    Pain Descriptors / Indicators  Sore;Tightness    Pain Type  Chronic pain    Pain Onset  More than a month ago    Pain Frequency  Intermittent    Aggravating Factors   wrist extension type movements     Multiple Pain Sites  No                       [facility] Adult PT Treatment/Exercise - 07/28/18 0905      Elbow Exercises   Forearm Supination  Left;15 reps;Seated;Strengthening    Forearm Supination Limitations  golf club held at distal end of grip    Forearm Pronation  Left;15 reps;Seated;Strengthening    Forearm Pronation Limitations  golf club held at distal end of grip       Shoulder Exercises: ROM/Strengthening   UBE (Upper Arm Bike)  L3.0 x 6 min (3' fwd/3' back)      Hand Exercises   Other Hand Exercises   L Stovall grip 55# (red spring) 2 x 20 focusing on slow eccentric release      Wrist Exercises   Wrist Flexion  Left;Strengthening;Seated;Bar weights/barbell;20 reps    Bar Weights/Barbell (Wrist Flexion)  --   8#   Wrist Extension  Left;10 reps    Bar Weights/Barbell (Wrist Extension)  --   8#    Wrist Extension Limitations  AAROM concentric with focus on slow eccentric control     Other wrist exercises  B wrist extension roll up with green TB on PVC pipe x 4    Other wrist exercises  L wrist extension stretch with slight ulnar deviation 3 x 30 sec      Modalities   Modalities  Cryotherapy      Cryotherapy   Number Minutes Cryotherapy  5 Minutes    Cryotherapy Location  Other (comment)   L wrist extensor group and L epicondyle    Type of Cryotherapy  Ice massage      Iontophoresis   Type of Iontophoresis  Dexamethasone  Location  L lateral epicondyle/wrist extensor group    Dose  80 mA-min, 1.0 mL    Time  4-6 hr patch (#2 of 6)                  PT Long Term Goals - 07/19/18 1706      PT LONG TERM GOAL #1   Title  Independent with ongoing/advanced HEP    Status  On-going      PT LONG TERM GOAL #2   Title  L grip strength within 10# of R w/o increased pain    Status  On-going      PT LONG TERM GOAL #3   Title  Patient will report ability to use L arm functionally with daily ADLs and task including lifting & carrying items w/o pain provocation    Status  On-going      PT LONG TERM GOAL #4   Title  Patient will report ability to return to playing tennis w/o increased pain at L elbow    Status  On-going            Plan - 07/28/18 1149    Clinical Impression Statement  Pt. reporting some benefit from K-taping to L wrist/forearm last session, removed this last night, and with mildly irrigated skin visible in today's session.  Did not reapplied K-taping today.  Armani tolerated progression of L wrist extensor/flexor eccentric strengthening activities  well today and mild progression in reps with extensor "wrist roller" with green TB resistance.  Reports improvement in pain intensity to 4/10 pain at most now with irritating activities with pain quickly subsiding at rest.  Ended visit with application of ionto patch #2/6 to L elbow in area of most tenderness.  Will monitor pt. response and continue to progress in upcoming session.      Rehab Potential  Good    PT Treatment/Interventions  Patient/family education;Therapeutic exercise;Therapeutic activities;Manual techniques;Dry needling;Passive range of motion;Taping;Ultrasound;Electrical Stimulation;Moist Heat;Cryotherapy;Iontophoresis 4mg /ml Dexamethasone;ADLs/Self Care Home Management    PT Next Visit Plan  assess response to ionto; Manual therapy including DN as indicated to L wrist extensor group; Flexibility and eccentirc focused strengthening; Modalities including ionto, [place], estim or ice massage as indicated    Consulted and Agree with Plan of Care  Patient       Patient will benefit from skilled therapeutic intervention in order to improve the following deficits and impairments:  Pain, Impaired flexibility, Increased muscle spasms, Increased fascial restricitons, Decreased strength, Impaired UE functional use, Decreased activity tolerance  Visit Diagnosis: Pain in left elbow  Stiffness of left elbow, not elsewhere classified  Other muscle spasm  Muscle weakness (generalized)     Problem List There are no active problems to display for this patient.   Kermit Balo, PTA 07/28/18 11:56 AM   [facility] [address]  [address] [place], [place], [address] Phone: [phone]   Fax:  [phone]  Name: Aniv Gell MRN: [id number] Date of Birth: [birth year]

## 2018-08-01 ENCOUNTER — Ambulatory Visit: Payer: BLUE CROSS/BLUE SHIELD | Admitting: Physical Therapy

## 2018-08-01 ENCOUNTER — Other Ambulatory Visit: Payer: Self-pay

## 2018-08-01 DIAGNOSIS — M25522 Pain in left elbow: Secondary | ICD-10-CM

## 2018-08-01 DIAGNOSIS — M25622 Stiffness of left elbow, not elsewhere classified: Secondary | ICD-10-CM

## 2018-08-01 DIAGNOSIS — M62838 Other muscle spasm: Secondary | ICD-10-CM

## 2018-08-01 DIAGNOSIS — M6281 Muscle weakness (generalized): Secondary | ICD-10-CM | POA: Diagnosis not present

## 2018-08-01 NOTE — Therapy (Signed)
[facility] [place] [address]  [address] [place], [place], [address] Phone: [phone]   Fax:  [phone]  Physical Therapy Treatment  Patient Details  Name: Richard Campos MRN: [id number] Date of Birth: 03/14/1981 Referring Provider (PT): Timothy Draper, MD   Encounter Date: 08/01/2018  PT End of Session - 08/01/18 1705    Visit Number  6    Number of Visits  8    Date for PT Re-Evaluation  08/10/18    Authorization Type  BCBS    PT Start Time  1705    PT Stop Time  1751    PT Time Calculation (min)  46 min    Activity Tolerance  Patient tolerated treatment well    Behavior During Therapy  [facility] for tasks assessed/performed       No past medical history on file.  No past surgical history on file.  There were no vitals filed for this visit.  Subjective Assessment - 08/01/18 1708    Subjective  Pt noting some increased soreness after working around the house doing things like hanging curtain rods.    Patient Stated Goals  "get back on the tennis court w/o hurting"    Currently in Pain?  No/denies                       [facility] Adult PT Treatment/Exercise - 08/01/18 1705      Elbow Exercises   Forearm Supination  Left;15 reps;Seated;Strengthening    Bar Weights/Barbell (Forearm Supination)  1 lb   cuff weight on head of golf club   Forearm Supination Limitations  golf club held 15" from handle end    Forearm Pronation  Left;15 reps;Seated;Strengthening    Bar Weights/Barbell (Forearm Pronation)  1 lb   cuff weight on head of golf club   Forearm Pronation Limitations  golf club held 15" from handle end      Shoulder Exercises: ROM/Strengthening   UBE (Upper Arm Bike)  L4.0 x 6 min (3' fwd/3' back)      Wrist Exercises   Wrist Flexion  Left;20 reps;Strengthening;Bar weights/barbell;Seated    Bar Weights/Barbell (Wrist Flexion)  --   8 lbs   Wrist Extension  Left;10 reps;Strengthening;Bar  weights/barbell;Seated   2 sets   Bar Weights/Barbell (Wrist Extension)  --   8 lbs   Wrist Extension Limitations  AAROM concentric with focus on slow eccentric control    Wrist Radial Deviation  Left;15 reps;Strengthening;Theraband;Seated    Theraband Level (Radial Deviation)  Level 3 (Green)    Wrist Ulnar Deviation  Left;15 reps;Strengthening;Theraband;Seated    Theraband Level (Ulnar Deviation)  Level 3 (Green)    Other wrist exercises  L wrist extension stretch with slight ulnar deviation 3 x 30 sec      Modalities   Modalities  Iontophoresis      Iontophoresis   Type of Iontophoresis  Dexamethasone    Location  L lateral epicondyle/wrist extensor group    Dose  80 mA-min, 1.0 mL    Time  4-6 hr patch (#3 of 6)      Manual Therapy   Manual Therapy  Soft tissue mobilization;Myofascial release    Soft tissue mobilization  STM & cross-friction massage to L wrist extensor group    Myofascial Release  pin & stretch to L wrist extensors       Trigger Point Dry Needling - 08/01/18 1705    Consent Given?  Yes      Muscles Treated Wrist/Hand  Extensor carpi radialis longus/brevis;Extensor digitorum;Extensor carpi ulnaris   Left   Extensor carpi radialis longus/brevis Response  Twitch response elicited;Palpable increased muscle length    Extensor digitorum Response  Twitch response elicited;Palpable increased muscle length    Extensor carpi ulnaris Response  Twitch response elicited;Palpable increased muscle length           PT Education - 08/01/18 1745    Education Details  HEP update - green TB radial & ulnar deviation    Person(s) Educated  Patient    Methods  Explanation;Demonstration;Handout    Comprehension  Verbalized understanding;Returned demonstration          PT Long Term Goals - 08/01/18 1709      PT LONG TERM GOAL #1   Title  Independent with ongoing/advanced HEP    Status  Partially Met      PT LONG TERM GOAL #2   Title  L grip strength within 10# of  R w/o increased pain    Status  On-going      PT LONG TERM GOAL #3   Title  Patient will report ability to use L arm functionally with daily ADLs and task including lifting & carrying items w/o pain provocation    Status  Partially Met      PT LONG TERM GOAL #4   Title  Patient will report ability to return to playing tennis w/o increased pain at L elbow    Status  On-going            Plan - 08/01/18 1710    Clinical Impression Statement  [name] reporting lessening awareness of pain with everyday activities, but did note some increased soreness after hanging curtain rods and working around the house over the weekend. Taut bands persisting in L wrist extensor group which were addressed with manual therapy including DN with good twitch responses and palpable reduction in muscle tension. Continued strengthening with eccentric focus adding green TB radial & ulnar deviation to HEP.     Rehab Potential  Good    PT Treatment/Interventions  Patient/family education;Therapeutic exercise;Therapeutic activities;Manual techniques;Dry needling;Passive range of motion;Taping;Ultrasound;Electrical Stimulation;Moist Heat;Cryotherapy;Iontophoresis 4mg/ml Dexamethasone;ADLs/Self Care Home Management    PT Next Visit Plan  Manual therapy including DN as indicated to L wrist extensor group; Flexibility and eccentric focused strengthening; Modalities including ionto, US, estim or ice massage as indicated    Consulted and Agree with Plan of Care  Patient       Patient will benefit from skilled therapeutic intervention in order to improve the following deficits and impairments:  Pain, Impaired flexibility, Increased muscle spasms, Increased fascial restricitons, Decreased strength, Impaired UE functional use, Decreased activity tolerance  Visit Diagnosis: Pain in left elbow  Stiffness of left elbow, not elsewhere classified  Other muscle spasm  Muscle weakness (generalized)     Problem List There  are no active problems to display for this patient.   JoAnne M Kreis, PT, MPT 08/01/2018, 6:26 PM  [facility] [place] [address]  [address] [place], [place], [address] Phone: [phone]   Fax:  [phone]  Name: Richard Campos MRN: [id number] Date of Birth: 05/19/1980   

## 2018-08-05 ENCOUNTER — Ambulatory Visit: Payer: BLUE CROSS/BLUE SHIELD

## 2018-08-08 ENCOUNTER — Ambulatory Visit: Payer: BLUE CROSS/BLUE SHIELD | Admitting: Physical Therapy

## 2018-09-20 ENCOUNTER — Ambulatory Visit: Payer: BC Managed Care – PPO | Attending: Sports Medicine | Admitting: Physical Therapy

## 2018-09-20 ENCOUNTER — Encounter: Payer: Self-pay | Admitting: Physical Therapy

## 2018-09-20 ENCOUNTER — Other Ambulatory Visit: Payer: Self-pay

## 2018-09-20 DIAGNOSIS — M62838 Other muscle spasm: Secondary | ICD-10-CM | POA: Diagnosis not present

## 2018-09-20 DIAGNOSIS — M25622 Stiffness of left elbow, not elsewhere classified: Secondary | ICD-10-CM

## 2018-09-20 DIAGNOSIS — M6281 Muscle weakness (generalized): Secondary | ICD-10-CM | POA: Diagnosis not present

## 2018-09-20 DIAGNOSIS — M25522 Pain in left elbow: Secondary | ICD-10-CM

## 2018-09-20 NOTE — Therapy (Signed)
[facility] [address]  [place], [place], [address] Phone: [phone]   Fax:  [phone]  Physical Therapy Treatment / Recert  Patient Details  Name: Richard Campos MRN: [id number] Date of Birth: 1980-07-12 Referring Provider (PT): Lilia Argue, MD   Encounter Date: 09/20/2018  PT End of Session - 09/20/18 0802    Visit Number  7    Number of Visits  11    Date for PT Re-Evaluation  10/18/18    Authorization Type  BCBS    PT Start Time  0802    PT Stop Time  0849    PT Time Calculation (min)  47 min    Activity Tolerance  Patient tolerated treatment well    Behavior During Therapy  [facility] for tasks assessed/performed       History reviewed. No pertinent past medical history.  History reviewed. No pertinent surgical history.  There were no vitals filed for this visit.  Subjective Assessment - 09/20/18 0804    Subjective  Pt reporting he has been working on his HEP qod or every third day - not much improvement and still noting intemittent pain with reaching and lifting.    Patient Stated Goals  "get back on the tennis court w/o hurting"    Currently in Pain?  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    Leisure  working out - lifting, running, stationary bike; tennis      Observation/Other Assessments   Focus on  Therapeutic Outcomes (FOTO)   Elbow - 79% (21% limitation)      Strength   Right Elbow Flexion  5/5    Right Elbow Extension  5/5    Left Elbow Flexion  5/5    Left Elbow Extension  5/5    Right Forearm Pronation  5/5    Right Forearm Supination  5/5    Left Forearm Pronation  5/5    Left Forearm Supination  5/5    Right Wrist Flexion  5/5    Right Wrist Extension  5/5    Right Wrist Radial Deviation  5/5    Right Wrist Ulnar Deviation  5/5    Left Wrist Flexion  5/5    Left Wrist Extension  4+/5    Left Wrist Radial Deviation  5/5    Left Wrist Ulnar Deviation  5/5    Right Hand Grip (lbs)  100   99, 101, 100   Right Hand Lateral Pinch  19.33 lbs   21, 18, 19   Right Hand 3 Point Pinch  17.33 lbs   19, 17, 16  Left Hand Grip (lbs)  93.33   95, 100, 85   Left Hand Lateral Pinch  16.67 lbs   18, 16, 16   Left Hand 3 Point Pinch  16.67 lbs   17, 17, 16                  [facility] Adult PT Treatment/Exercise - 09/20/18 0802      Exercises   Exercises  Elbow;Wrist      Shoulder Exercises: Standing   Diagonals  Left;15 reps;Theraband;Strengthening    Theraband Level (Shoulder Diagonals)  Level 3 (Green)    Diagonals Limitations  D1/D2 flexion & extension - emhasizing wrist & forearm motions             PT Education - 09/20/18 0847    Education Details  HEP update - UE diagonals with green TB    Person(s) Educated  Patient    Methods  Explanation;Demonstration;Handout    Comprehension  Verbalized understanding;Returned demonstration;Need further instruction          PT Long Term Goals - 09/20/18 0807      PT LONG TERM GOAL #1   Title  Independent with ongoing/advanced HEP    Status  Partially Met    Target Date  10/18/18      PT LONG TERM GOAL #2   Title  L grip strength within 10# of R w/o increased pain    Status  Achieved      PT LONG TERM GOAL #3   Title  Patient will report ability to use L arm functionally with daily ADLs and task  including lifting & carrying items w/o pain provocation    Status  Partially Met    Target Date  10/18/18      PT LONG TERM GOAL #4   Title  Patient will report ability to return to playing tennis w/o increased pain at L elbow    Status  Unable to assess    Target Date  10/18/18      PT LONG TERM GOAL #5   Title  Patient will report ability to lift/carry items with wide grip w/o increased pain    Status  New    Target Date  10/18/18            Plan - 09/20/18 0849    Clinical Impression Statement  [name] returning to PT after ~6-week absence due to COVID-19 restrictions.  He reports compliance with HEP completing exercises every other or every third day. States pain less common but still present at 2/10 when reaching and lifting with L hand using wide grip (i.e. large glass) and elbow extended or when doing hammer curls. States he has not yet attempted to play tennis again. FOTO improved from 37% limitation to 21% limitation). Overall L UE strength now 5/5 with exception of L wrist extension 4+/5, and grip strength also improved with L grip now within 10 lbs of R - LTG #2 met. Remaining goals ongoing or only partially met. Taut bands persist in L wrist extensor group but less ttp than previously. Patient wishing to proceed with PT given prior benefit with some ongoing pain, therefore will recert for additional 1x/wk for up to 4 weeks to address remaining deficits. Today's treatment focusing on review of existing HEP with addition of UE diagonals to HEP.     Rehab Potential  Good    PT Treatment/Interventions  Patient/family education;Therapeutic exercise;Therapeutic activities;Manual techniques;Dry needling;Passive range of motion;Taping;Ultrasound;Electrical Stimulation;Moist Heat;Cryotherapy;Iontophoresis 18m/ml Dexamethasone;ADLs/Self Care  Home Management    PT Next Visit Plan  Manual therapy including DN as indicated to L wrist extensor group; Flexibility and eccentric focused  strengthening; Modalities including ionto, [place], estim or ice massage as indicated    Consulted and Agree with Plan of Care  Patient       Patient will benefit from skilled therapeutic intervention in order to improve the following deficits and impairments:  Pain, Impaired flexibility, Increased muscle spasms, Increased fascial restricitons, Decreased strength, Impaired UE functional use, Decreased activity tolerance  Visit Diagnosis: Pain in left elbow  Stiffness of left elbow, not elsewhere classified  Other muscle spasm  Muscle weakness (generalized)     Problem List There are no active problems to display for this patient.   Percival Spanish, PT, MPT 09/20/2018, 12:35 PM  [facility] [address]  [place], [place], [address] Phone: [phone]   Fax:  [phone]  Name: Lovett Coffin MRN: [id number] Date of Birth: 1981/03/05

## 2018-09-27 ENCOUNTER — Encounter: Payer: Self-pay | Admitting: Physical Therapy

## 2018-09-27 ENCOUNTER — Other Ambulatory Visit: Payer: Self-pay

## 2018-09-27 ENCOUNTER — Ambulatory Visit: Payer: BC Managed Care – PPO | Admitting: Physical Therapy

## 2018-09-27 DIAGNOSIS — M6281 Muscle weakness (generalized): Secondary | ICD-10-CM | POA: Diagnosis not present

## 2018-09-27 DIAGNOSIS — M25622 Stiffness of left elbow, not elsewhere classified: Secondary | ICD-10-CM | POA: Diagnosis not present

## 2018-09-27 DIAGNOSIS — M25522 Pain in left elbow: Secondary | ICD-10-CM

## 2018-09-27 DIAGNOSIS — M62838 Other muscle spasm: Secondary | ICD-10-CM

## 2018-09-27 NOTE — Therapy (Signed)
[facility] [address]  [place], [place], [address] Phone: [phone]   Fax:  [phone]  Physical Therapy Treatment  Patient Details  Name: Richard Campos MRN: [id number] Date of Birth: 1981-01-19 Referring Provider (PT): Lilia Argue, MD   Encounter Date: 09/27/2018  PT End of Session - 09/27/18 0801    Visit Number  8    Number of Visits  11    Date for PT Re-Evaluation  10/18/18    Authorization Type  BCBS    PT Start Time  0801    PT Stop Time  6468    PT Time Calculation (min)  46 min    Activity Tolerance  Patient tolerated treatment well    Behavior During Therapy  Richard Campos for tasks assessed/performed       History reviewed. No pertinent past medical history.  History reviewed. No pertinent surgical history.  There were no vitals filed for this visit.  Subjective Assessment - 09/27/18 0804    Subjective  Pt with no new concerns or complaints. Still reporting intermittent pain.    Patient Stated Goals  "get back on the tennis court w/o hurting"    Currently in Pain?  No/denies                       [facility] Adult PT Treatment/Exercise - 09/27/18 0801      Exercises   Exercises  Elbow;Wrist;Hand      Shoulder Exercises: ROM/Strengthening   UBE (Upper Arm Bike)  L4.0 x 6 min (3' fwd/3' back)      Hand Exercises   Other Hand Exercises  L Stovall grip 70# (silver spring) x20, 85# (red spring) x 20 with elbow extended and varying degree of pronation/supination - focusing on slow eccentric release    Other Hand Exercises  Green Theraputty grip stregthening varying elbow flexion & extension and forearm pronation & supination x ~2 minutes - pt noting mild discomfort with full elbow extension + forearm pronation as well as mid range elbow flexion with neutral forearm but pain not consistent (comes & goes)      Modalities   Modalities  Iontophoresis      Iontophoresis   Type of  Iontophoresis  Dexamethasone    Location  L lateral epicondyle/wrist extensor group    Dose  80 mA-min, 1.0 mL    Time  4-6 hr patch (#4 of 6)      Manual Therapy   Manual Therapy  Soft tissue mobilization;Myofascial release    Soft tissue mobilization  STM & cross-friction massage to L wrist extensor group    Myofascial Release  pin & stretch to L wrist extensors       Trigger Point Dry Needling - 09/27/18 0801    Consent Given?  Yes    Muscles Treated Wrist/Hand  Extensor carpi radialis longus/brevis;Extensor digitorum;Extensor carpi ulnaris   Left   Extensor carpi radialis longus/brevis Response  Twitch response elicited;Palpable increased muscle length    Extensor digitorum Response  Twitch response elicited;Palpable increased muscle length    Extensor carpi ulnaris Response  Twitch response elicited;Palpable increased muscle length           PT Education - 09/27/18 0847    Education Details  Richard Campos theraputty provided for home grip strengthening    Person(s) Educated  Patient    Methods  Explanation;Demonstration    Comprehension  Verbalized understanding;Returned demonstration  PT Long Term Goals - 09/27/18 0805      PT LONG TERM GOAL #1   Title  Independent with ongoing/advanced HEP    Status  Partially Met    Target Date  10/18/18      PT LONG TERM GOAL #2   Title  L grip strength within 10# of R w/o increased pain    Status  Achieved      PT LONG TERM GOAL #3   Title  Patient will report ability to use L arm functionally with daily ADLs and task including lifting & carrying items w/o pain provocation    Status  Partially Met    Target Date  10/18/18      PT LONG TERM GOAL #4   Title  Patient will report ability to return to playing tennis w/o increased pain at L elbow    Status  Unable to assess    Target Date  10/18/18      PT LONG TERM GOAL #5   Title  Patient will report ability to lift/carry items with wide grip w/o increased pain     Status  On-going    Target Date  10/18/18            Plan - 09/27/18 0806    Clinical Impression Statement  Richard Campos continues to note pain most common with wide grip while elbow extended reaching away from body - attempted to duplicate triggering motion/activity but unable to consistently reproduce arm position and activity. Green theraputty provided for home grip strengthening. Taut bands still present in L wrist extensor group which were addressed with manual therapy and DN with good twitch response and palpable reduction in muscle tension. Treatment concluded with 4th ionto patch to promote further reduciton in pain/inflammation/irritation.    Rehab Potential  Good    PT Treatment/Interventions  Patient/family education;Therapeutic exercise;Therapeutic activities;Manual techniques;Dry needling;Passive range of motion;Taping;Ultrasound;Electrical Stimulation;Moist Heat;Cryotherapy;Iontophoresis 41m/ml Dexamethasone;ADLs/Self Care Home Management    PT Next Visit Plan  Manual therapy including DN as indicated to L wrist extensor group; Flexibility and eccentric focused strengthening; Modalities including ionto, [place] estim or ice massage as indicated    Consulted and Agree with Plan of Care  Patient       Patient will benefit from skilled therapeutic intervention in order to improve the following deficits and impairments:  Pain, Impaired flexibility, Increased muscle spasms, Increased fascial restricitons, Decreased strength, Impaired UE functional use, Decreased activity tolerance  Visit Diagnosis: Pain in left elbow  Stiffness of left elbow, not elsewhere classified  Other muscle spasm  Muscle weakness (generalized)     Problem List There are no active problems to display for this patient.   Richard Campos PT, MPT 09/27/2018, 9:04 AM  [facility] [place] [address]Phone: [phone]  Fax:   [phone] Name: Richard HyMRN: [id number] of Birth: [birth year]

## 2018-10-04 ENCOUNTER — Ambulatory Visit: Payer: BC Managed Care – PPO

## 2018-10-04 ENCOUNTER — Other Ambulatory Visit: Payer: Self-pay

## 2018-10-04 DIAGNOSIS — M62838 Other muscle spasm: Secondary | ICD-10-CM | POA: Diagnosis not present

## 2018-10-04 DIAGNOSIS — M25522 Pain in left elbow: Secondary | ICD-10-CM | POA: Diagnosis not present

## 2018-10-04 DIAGNOSIS — M25622 Stiffness of left elbow, not elsewhere classified: Secondary | ICD-10-CM

## 2018-10-04 DIAGNOSIS — M6281 Muscle weakness (generalized): Secondary | ICD-10-CM

## 2018-10-04 NOTE — Therapy (Signed)
[facility] [address]  [place], [place], [address] Phone: [phone]   Fax:  [phone]  Physical Therapy Treatment  Patient Details  Name: Richard Campos MRN: [id number] Date of Birth: 06/06/1980 Referring Provider (PT): Lilia Argue, MD   Encounter Date: 10/04/2018  PT End of Session - 10/04/18 0811    Visit Number  9    Number of Visits  11    Date for PT Re-Evaluation  10/18/18    Authorization Type  BCBS    PT Start Time  0805    PT Stop Time  0850    PT Time Calculation (min)  45 min    Activity Tolerance  Patient tolerated treatment well    Behavior During Therapy  [facility] for tasks assessed/performed       No past medical history on file.  No past surgical history on file.  There were no vitals filed for this visit.  Subjective Assessment - 10/04/18 0807    Subjective  Pt. noting some L elbow pain after cleaning house yesterday.  Feels improvement with PT thus far is hard to quantify however notes he is sure elbow pain has improved since attending PT.      Patient Stated Goals  "get back on the tennis court w/o hurting"    Currently in Pain?  No/denies    Pain Score  0-No pain   Pt. noting pain rising to 4/10 sharp pain at most    Pain Location  Elbow    Pain Orientation  Left;Lateral    Pain Descriptors / Indicators  Tightness;Sharp    Pain Type  Chronic pain    Aggravating Factors   picking up glass of water when arm fully extended    Multiple Pain Sites  No                       [facility] Adult PT Treatment/Exercise - 10/04/18 0001      Elbow Exercises   Forearm Supination  Left;10 reps    Forearm Supination Limitations  golf club     Forearm Pronation  Left;10 reps    Forearm Pronation Limitations  golf club      Shoulder Exercises: ROM/Strengthening   UBE (Upper Arm Bike)  L4.0 x 6 min (3' fwd/3' back)      Shoulder Exercises: Body Blade   External Rotation  30  seconds;1 rep    External Rotation Limitations  with focus on isometric wrist positioning resisting flexion extension; 1 set elbow flexed by side; 1 set elbow extended      Hand Exercises   Other Hand Exercises  L Stovall grip 70# (silver spring) x 20, 85# (red spring) with elbow in extended positioning;       Wrist Exercises   Wrist Flexion  Left;20 reps;Strengthening;Bar weights/barbell;Seated    Wrist Flexion Limitations  --   AAROM concentric with focus    Wrist Extension  Left;10 reps;Strengthening;Bar weights/barbell;Seated    Bar Weights/Barbell (Wrist Extension)  --   8#   Wrist Extension Limitations  AAROM concentric with focus on slow eccentric control     Other wrist exercises  L wrist extension stretch with slight ulnar deviation 3 x 30 sec      Iontophoresis   Type of Iontophoresis  Dexamethasone    Location  L lateral epicondyle/wrist extensor group    Dose  80 mA-min, 1.0 mL    Time  4-6 hr  patch (#5 of 6)                  PT Long Term Goals - 09/27/18 0805      PT LONG TERM GOAL #1   Title  Independent with ongoing/advanced HEP    Status  Partially Met    Target Date  10/18/18      PT LONG TERM GOAL #2   Title  L grip strength within 10# of R w/o increased pain    Status  Achieved      PT LONG TERM GOAL #3   Title  Patient will report ability to use L arm functionally with daily ADLs and task including lifting & carrying items w/o pain provocation    Status  Partially Met    Target Date  10/18/18      PT LONG TERM GOAL #4   Title  Patient will report ability to return to playing tennis w/o increased pain at L elbow    Status  Unable to assess    Target Date  10/18/18      PT LONG TERM GOAL #5   Title  Patient will report ability to lift/carry items with wide grip w/o increased pain    Status  On-going    Target Date  10/18/18            Plan - 10/04/18 0813    Clinical Impression Statement  Pt. noting benefit from DN last session  reporting, "it always helps".  Tolerated mild increase in repetitions and resistance with forearm/wrist eccentric strengthening activities today without pain.  did include body blade shoulder/wrist strengthening activities without issue however pt. noting wrist extension fatigue.  Ended session with application of ionto patch #4/6 to lateral elbow per pt. request.  Will continue to progress toward goals.      Rehab Potential  Good    PT Treatment/Interventions  Patient/family education;Therapeutic exercise;Therapeutic activities;Manual techniques;Dry needling;Passive range of motion;Taping;Ultrasound;Electrical Stimulation;Moist Heat;Cryotherapy;Iontophoresis 36m/ml Dexamethasone;ADLs/Self Care Home Management    PT Next Visit Plan  Manual therapy including DN as indicated to L wrist extensor group; Flexibility and eccentric focused strengthening; Modalities including ionto, [place] estim or ice massage as indicated    Consulted and Agree with Plan of Care  Patient       Patient will benefit from skilled therapeutic intervention in order to improve the following deficits and impairments:  Pain, Impaired flexibility, Increased muscle spasms, Increased fascial restricitons, Decreased strength, Impaired UE functional use, Decreased activity tolerance  Visit Diagnosis: Pain in left elbow  Stiffness of left elbow, not elsewhere classified  Other muscle spasm  Muscle weakness (generalized)     Problem List There are no active problems to display for this patient.   MBess Harvest PTA 10/04/18 1:59 PM     [facility] [address] [place] [address]Phone: [phone]  Fax:  [phone] Name: PBeaux WedemeyerMRN: [id number] of Birth: 6Jul 25, 1982

## 2018-10-11 ENCOUNTER — Ambulatory Visit: Payer: BC Managed Care – PPO

## 2018-10-12 ENCOUNTER — Ambulatory Visit: Payer: BC Managed Care – PPO

## 2018-10-12 ENCOUNTER — Other Ambulatory Visit: Payer: Self-pay

## 2018-10-12 DIAGNOSIS — M25622 Stiffness of left elbow, not elsewhere classified: Secondary | ICD-10-CM | POA: Diagnosis not present

## 2018-10-12 DIAGNOSIS — M25522 Pain in left elbow: Secondary | ICD-10-CM

## 2018-10-12 DIAGNOSIS — M62838 Other muscle spasm: Secondary | ICD-10-CM

## 2018-10-12 DIAGNOSIS — M6281 Muscle weakness (generalized): Secondary | ICD-10-CM | POA: Diagnosis not present

## 2018-10-12 NOTE — Therapy (Signed)
[facility] [address]  [place], [place], [address] Phone: [phone]   Fax:  [phone]  Physical Therapy Treatment  Patient Details  Name: Richard [facility] MRN: [id number] Date of Birth: 08/03/1980 Referring Provider (PT): Lilia Argue, MD   Encounter Date: 10/12/2018  PT End of Session - 10/12/18 0856    Visit Number  10    Number of Visits  11    Date for PT Re-Evaluation  10/18/18    Authorization Type  BCBS    PT Start Time  0800    PT Stop Time  8502   Ended with 10 min ice pack    PT Time Calculation (min)  55 min    Activity Tolerance  Patient tolerated treatment well    Behavior During Therapy  [facility] for tasks assessed/performed       No past medical history on file.  No past surgical history on file.  There were no vitals filed for this visit.  Subjective Assessment - 10/12/18 0803    Subjective  Pt. reporting some improvement in L lateral elbow pain.  "Tested it out" this morning lifting heavy water jug with wide grip with less pain than typically felt with this type of movement.      Patient Stated Goals  "get back on the tennis court w/o hurting"    Currently in Pain?  No/denies    Pain Score  0-No pain   rising to 4/10 pain sharp at times grasping wide heavy objects    Pain Location  Elbow    Pain Orientation  Left;Lateral    Pain Descriptors / Indicators  Tightness;Sharp    Pain Type  Chronic pain    Pain Onset  More than a month ago    Pain Frequency  Intermittent    Aggravating Factors   picking up thermos of water     Multiple Pain Sites  No                       [facility] Adult PT Treatment/Exercise - 10/12/18 0001      Elbow Exercises   Forearm Supination  Left   x 12 reps   Forearm Supination Limitations  golf club end grip    Forearm Pronation  Left   x 12 reps   Forearm Pronation Limitations  golf club end grip    Other elbow exercises  L wrist flexion,  extension stretch 2 x 30sec each       Shoulder Exercises: ROM/Strengthening   UBE (Upper Arm Bike)  L4.0 x 6 min (3' fwd/3' back)      Shoulder Exercises: Body Blade   External Rotation  30 seconds;2 reps    External Rotation Limitations  with focus on isometric wrist positioning resisting flexion extension; 1 set elbow flexed by side; 1 set elbow extended    Other Body Blade Exercises  L shoulder/wrist body blade 2 x 20 sec moving across body into horizontal adduction into tennis backhand positioning       Hand Exercises   Other Hand Exercises  L Stovall grip 85# (red spring) with elbow in extended positioning and towel around grip to widen grasp (to simulate painful positioning when grasping wide objects at home 2 x 20 reps; 3" release       Wrist Exercises   Wrist Flexion  Left;20 reps;Strengthening;Bar weights/barbell;Seated    Wrist Flexion Limitations  AAROM concentric with focus on slow  eccentric    8#   Wrist Extension  Left;Strengthening;Bar weights/barbell;Seated   x 12 reps    Bar Weights/Barbell (Wrist Extension)  --   8#   Wrist Extension Limitations  AAROM concentric with focus on slow eccentric control     Wrist Radial Deviation  10 reps;Right    Wrist Radial Deviation Limitations  Golf club end grip seated at table     Other wrist exercises  B wrist extension, flexion roll up with green TB on PVC pipe x 5 each way     Other wrist exercises  Prone drop and catch shoulder in  90/90 positioning red med ball (1000Gr) 2 x 30 sec in prone positioning on mat table       Cryotherapy   Number Minutes Cryotherapy  10 Minutes    Cryotherapy Location  Forearm   L lateral elbow    Type of Cryotherapy  Ice pack      Manual Therapy   Manual Therapy  Soft tissue mobilization    Manual therapy comments  supine     Soft tissue mobilization  STM & cross-friction massage to L wrist extensor group   no significant ttp             PT Education - 10/12/18 1057    Education  Details  HEP update; green TB issued pt.     Person(s) Educated  Patient    Methods  Explanation;Demonstration;Verbal cues;Handout    Comprehension  Verbalized understanding;Returned demonstration;Verbal cues required;Need further instruction          PT Long Term Goals - 09/27/18 0805      PT LONG TERM GOAL #1   Title  Independent with ongoing/advanced HEP    Status  Partially Met    Target Date  10/18/18      PT LONG TERM GOAL #2   Title  L grip strength within 10# of R w/o increased pain    Status  Achieved      PT LONG TERM GOAL #3   Title  Patient will report ability to use L arm functionally with daily ADLs and task including lifting & carrying items w/o pain provocation    Status  Partially Met    Target Date  10/18/18      PT LONG TERM GOAL #4   Title  Patient will report ability to return to playing tennis w/o increased pain at L elbow    Status  Unable to assess    Target Date  10/18/18      PT LONG TERM GOAL #5   Title  Patient will report ability to lift/carry items with wide grip w/o increased pain    Status  On-going    Target Date  10/18/18            Plan - 10/12/18 0804    Clinical Impression Statement  Pt. reporting some improvement in pain levels with daily activities over this past week.  Still having some occasional pain with grasping wide, heavy objects throughout day.  Pt. noting, "I tested it out this morning and grabbed a wide water jug and noticed less pain than usual".  Progressed dynamic body blade wrist extension activities today in session as well as prone drop/catch with med ball without complaint of pain.  Able to progress repetitions with all wrist/forearm strengthening activities today focusing on eccentric wrist extension loading.  Pt. noting he still has not attempted tennis.  Informed pt. of approaching end of current  POC with pt. verbalizing he is unsure if he wishes to continued and will attempt to "get out and hit a few balls" before  next session to measure tolerance for this activity.      PT Treatment/Interventions  Patient/family education;Therapeutic exercise;Therapeutic activities;Manual techniques;Dry needling;Passive range of motion;Taping;Ultrasound;Electrical Stimulation;Moist Heat;Cryotherapy;Iontophoresis 26m/ml Dexamethasone;ADLs/Self Care Home Management    PT Next Visit Plan  Manual therapy including DN as indicated to L wrist extensor group; Flexibility and eccentric focused strengthening; Modalities including ionto, [place] estim or ice massage as indicated    Consulted and Agree with Plan of Care  Patient       Patient will benefit from skilled therapeutic intervention in order to improve the following deficits and impairments:  Pain, Impaired flexibility, Increased muscle spasms, Increased fascial restricitons, Decreased strength, Impaired UE functional use, Decreased activity tolerance  Visit Diagnosis: Pain in left elbow  Stiffness of left elbow, not elsewhere classified  Other muscle spasm  Muscle weakness (generalized)     Problem List There are no active problems to display for this patient.   MBess [facility]pos PTA 10/12/18 11:14 AM   [facility] [address] [place] [address]Phone: [phone]  Fax:  [phone] Name: PShaunak KreisMRN: [id number] of Birth: 615-Feb-1982

## 2018-10-18 ENCOUNTER — Ambulatory Visit: Payer: BC Managed Care – PPO | Attending: Sports Medicine | Admitting: Physical Therapy

## 2018-10-18 ENCOUNTER — Encounter: Payer: Self-pay | Admitting: Physical Therapy

## 2018-10-18 ENCOUNTER — Other Ambulatory Visit: Payer: Self-pay

## 2018-10-18 DIAGNOSIS — M62838 Other muscle spasm: Secondary | ICD-10-CM | POA: Insufficient documentation

## 2018-10-18 DIAGNOSIS — M6281 Muscle weakness (generalized): Secondary | ICD-10-CM | POA: Diagnosis not present

## 2018-10-18 DIAGNOSIS — M25522 Pain in left elbow: Secondary | ICD-10-CM | POA: Diagnosis not present

## 2018-10-18 DIAGNOSIS — M25622 Stiffness of left elbow, not elsewhere classified: Secondary | ICD-10-CM | POA: Insufficient documentation

## 2018-10-18 NOTE — Therapy (Addendum)
[facility] [address]  [place], [place], [address] Phone: [phone]   Fax:  [phone]  Physical Therapy Treatment / Discharge Summary  Patient Details  Name: Richard [facility] MRN: [id number] Date of Birth: 04/25/1981 Referring Provider (PT): Lilia Argue, MD   Encounter Date: 10/18/2018  PT End of Session - 10/18/18 0802    Visit Number  11    Number of Visits  11    Date for PT Re-Evaluation  10/18/18    Authorization Type  BCBS    PT Start Time  0802    PT Stop Time  0841    PT Time Calculation (min)  39 min    Activity Tolerance  Patient tolerated treatment well    Behavior During Therapy  [facility] for tasks assessed/performed       History reviewed. No pertinent past medical history.  History reviewed. No pertinent surgical history.  There were no vitals filed for this visit.  Subjective Assessment - 10/18/18 0803    Subjective  Improving tolerance for lifting/gripping - able to help mount a TV (>100#) on the wall over the weekend w/o pain.    Patient Stated Goals  "get back on the tennis court w/o hurting"    Currently in Pain?  No/denies    Pain Onset  More than a month ago         [facility] PT Assessment - 10/18/18 0802      Assessment   Medical Diagnosis  L lateral epicondylitis    Referring Provider (PT)  Lilia Argue, MD    Onset Date/Surgical Date  --   ~Sept 2019   Hand Dominance  Right    Next MD Visit  PRN      AROM   Overall AROM   Within functional limits for tasks performed      Strength   Right Elbow Flexion  5/5    Right Elbow Extension  5/5    Left Elbow Flexion  5/5    Left Elbow Extension  5/5    Right Forearm Pronation  5/5    Right Forearm Supination  5/5    Left Forearm Pronation  5/5    Left Forearm Supination  5/5    Right Wrist Flexion  5/5    Right Wrist Extension  5/5    Right Wrist Radial Deviation  5/5    Right Wrist Ulnar Deviation  5/5    Left Wrist  Flexion  5/5    Left Wrist Extension  5/5    Left Wrist Radial Deviation  5/5    Left Wrist Ulnar Deviation  5/5    Right Hand Gross Grasp  Functional    Right Hand Grip (lbs)  89   85, 92, 90   Right Hand Lateral Pinch  19 lbs   21, 18, 18   Right Hand 3 Point Pinch  16.67 lbs   17, 19, 14   Left Hand Gross Grasp  Functional    Left Hand Grip (lbs)  81   80, 85, 78   Left Hand Lateral Pinch  15.67 lbs   15, 16, 15   Left Hand 3 Point Pinch  17.67 lbs   18, 17, 17                  [facility] Adult PT Treatment/Exercise - 10/18/18 0802      Shoulder Exercises: ROM/Strengthening   UBE (Upper Arm Bike)  L4.0 x  6 min (3' fwd/3' back)      Wrist Exercises   Wrist Extension  Left;15 reps;Seated;Theraband;AAROM;Strengthening    Theraband Level (Wrist Extension)  Level 3 (Green)    Wrist Extension Limitations  AAROM concentric with focus on slow eccentric control    Wrist Radial Deviation  Left;15 reps;Strengthening;Theraband;Seated    Theraband Level (Radial Deviation)  Level 4 (Blue)    Wrist Ulnar Deviation  Left;15 reps;Strengthening;Theraband;Seated    Theraband Level (Ulnar Deviation)  Level 4 (Blue)                  PT Long Term Goals - 10/18/18 0807      PT LONG TERM GOAL #1   Title  Independent with ongoing/advanced HEP    Status  Achieved      PT LONG TERM GOAL #2   Title  L grip strength within 10# of R w/o increased pain    Status  Achieved      PT LONG TERM GOAL #3   Title  Patient will report ability to use L arm functionally with daily ADLs and task including lifting & carrying items w/o pain provocation    Status  Achieved      PT LONG TERM GOAL #4   Title  Patient will report ability to return to playing tennis w/o increased pain at L elbow    Status  Unable to assess      PT LONG TERM GOAL #5   Title  Patient will report ability to lift/carry items with wide grip w/o increased pain    Status  Achieved            Plan -  10/18/18 0806    Clinical Impression Statement  [name] reporting decreased incidence and intensity of pain - able to help mount a >100# TV on a wall this weekend and able to grasp objects with wide grip while arm extended w/o pain provocation but still will randomly experience a twinge now and then w/o predictable trigger. L arm, wrist and hand/grip strength now WNL and nearly symmetrical to R. Given social restrictions due to COVID-19, he has not yet attempted to resume playing tennis but reports plan to purchase a new racquet and strings which his research indicates are designed to reduce likelihood of developing tennis elbow as well as schedule a few sessions with a tennis pro to evaluate his swing, especially his backhand, and ease him back into playing. All goals met with exception of unable to assess LTG #4 (tennis goal) due to not having yet attempted to resume playing. Patient feels comfortable with plan to transition to HEP at this time and hopes to be able to resume tennis within the next month, therefore will place him on hold for 30 days in the event that issues arise as he starts playing tennis again.    Rehab Potential  Good    PT Treatment/Interventions  Patient/family education;Therapeutic exercise;Therapeutic activities;Manual techniques;Dry needling;Passive range of motion;Taping;Ultrasound;Electrical Stimulation;Moist Heat;Cryotherapy;Iontophoresis 30m/ml Dexamethasone;ADLs/Self Care Home Management    PT Next Visit Plan  30-day hold    Consulted and Agree with Plan of Care  Patient       Patient will benefit from skilled therapeutic intervention in order to improve the following deficits and impairments:  Pain, Impaired flexibility, Increased muscle spasms, Increased fascial restricitons, Decreased strength, Impaired UE functional use, Decreased activity tolerance  Visit Diagnosis: Pain in left elbow  Stiffness of left elbow, not elsewhere classified  Other muscle spasm  Muscle  weakness (generalized)     Problem List There are no active problems to display for this patient.   Percival Spanish, PT, MPT 10/18/2018, 8:57 AM  [facility] [address]  [place], [place], [address] Phone: [phone]   Fax:  [phone]  Name: Sirus Labrie MRN: [id number] Date of Birth: [birth year]  PHYSICAL THERAPY DISCHARGE SUMMARY  Visits from Start of Care: 11  Current functional level related to goals / functional outcomes:   Refer to above clinical impression for status as of last visit on 10/18/2018. Patient was placed on hold for 30 days and has not needed to return to PT, therefore will proceed with discharge from PT for this episode.   Remaining deficits:   As above.   Education / Equipment:   HEP  Plan: Patient agrees to discharge.  Patient goals were met. Patient is being discharged due to meeting the stated rehab goals.  ?????     Percival Spanish, PT, MPT 11/25/18, 9:20 AM  [facility] [address]  [place], [place], [address] Phone: [phone]   Fax:  [phone]

## 2018-12-21 ENCOUNTER — Encounter: Payer: Self-pay | Admitting: Internal Medicine

## 2018-12-21 ENCOUNTER — Other Ambulatory Visit: Payer: Self-pay | Admitting: Internal Medicine

## 2018-12-21 ENCOUNTER — Ambulatory Visit: Payer: BC Managed Care – PPO | Admitting: Internal Medicine

## 2018-12-21 ENCOUNTER — Other Ambulatory Visit: Payer: Self-pay

## 2018-12-21 VITALS — BP 102/64 | HR 64 | Temp 98.0°F | Ht 75.0 in | Wt 184.9 lb

## 2018-12-21 DIAGNOSIS — K219 Gastro-esophageal reflux disease without esophagitis: Secondary | ICD-10-CM | POA: Diagnosis not present

## 2018-12-21 DIAGNOSIS — Z23 Encounter for immunization: Secondary | ICD-10-CM | POA: Diagnosis not present

## 2018-12-21 DIAGNOSIS — E559 Vitamin D deficiency, unspecified: Secondary | ICD-10-CM | POA: Insufficient documentation

## 2018-12-21 DIAGNOSIS — M7712 Lateral epicondylitis, left elbow: Secondary | ICD-10-CM | POA: Diagnosis not present

## 2018-12-21 DIAGNOSIS — E538 Deficiency of other specified B group vitamins: Secondary | ICD-10-CM | POA: Insufficient documentation

## 2018-12-21 DIAGNOSIS — H6122 Impacted cerumen, left ear: Secondary | ICD-10-CM | POA: Diagnosis not present

## 2018-12-21 DIAGNOSIS — Z0000 Encounter for general adult medical examination without abnormal findings: Secondary | ICD-10-CM | POA: Diagnosis not present

## 2018-12-21 LAB — COMPREHENSIVE METABOLIC PANEL
ALT: 20 U/L (ref 0–53)
AST: 17 U/L (ref 0–37)
Albumin: 4.8 g/dL (ref 3.5–5.2)
Alkaline Phosphatase: 48 U/L (ref 39–117)
BUN: 16 mg/dL (ref 6–23)
CO2: 31 mEq/L (ref 19–32)
Calcium: 10 mg/dL (ref 8.4–10.5)
Chloride: 103 mEq/L (ref 96–112)
Creatinine, Ser: 1.26 mg/dL (ref 0.40–1.50)
GFR: 64.69 mL/min (ref >60.00)
Glucose, Bld: 78 mg/dL (ref 70–99)
Potassium: 4.5 mEq/L (ref 3.5–5.1)
Sodium: 140 mEq/L (ref 135–145)
Total Bilirubin: 0.9 mg/dL (ref 0.2–1.2)
Total Protein: 7.1 g/dL (ref 6.0–8.3)

## 2018-12-21 LAB — CBC WITH DIFFERENTIAL/PLATELET
Basophils Absolute: 0 10*3/uL (ref 0.0–0.1)
Basophils Relative: 0.9 % (ref 0.0–3.0)
Eosinophils Absolute: 0.1 10*3/uL (ref 0.0–0.7)
Eosinophils Relative: 2 % (ref 0.0–5.0)
HCT: 45.2 % (ref 39.0–52.0)
Hemoglobin: 15.7 g/dL (ref 13.0–17.0)
Lymphocytes Relative: 28.3 % (ref 12.0–46.0)
Lymphs Abs: 1.3 10*3/uL (ref 0.7–4.0)
MCHC: 34.7 g/dL (ref 30.0–36.0)
MCV: 93.8 fl (ref 78.0–100.0)
Monocytes Absolute: 0.4 10*3/uL (ref 0.1–1.0)
Monocytes Relative: 8.4 % (ref 3.0–12.0)
Neutro Abs: 2.7 10*3/uL (ref 1.4–7.7)
Neutrophils Relative %: 60.4 % (ref 43.0–77.0)
Platelets: 270 10*3/uL (ref 150.0–400.0)
RBC: 4.82 Mil/uL (ref 4.22–5.81)
RDW: 12.4 % (ref 11.5–15.5)
WBC: 4.5 10*3/uL (ref 4.0–10.5)

## 2018-12-21 LAB — LIPID PANEL
Cholesterol: 118 mg/dL (ref 0–200)
HDL: 51 mg/dL (ref >39.00)
LDL Cholesterol: 60 mg/dL (ref 0–99)
NonHDL: 66.7
Total CHOL/HDL Ratio: 2
Triglycerides: 35 mg/dL (ref 0.0–149.0)
VLDL: 7 mg/dL (ref 0.0–40.0)

## 2018-12-21 LAB — HEMOGLOBIN A1C: Hgb A1c MFr Bld: 5.3 % (ref 4.6–6.5)

## 2018-12-21 LAB — TSH: TSH: 1.19 u[IU]/mL (ref 0.35–4.50)

## 2018-12-21 LAB — VITAMIN D 25 HYDROXY (VIT D DEFICIENCY, FRACTURES): VITD: 27.66 ng/mL — ABNORMAL LOW (ref 30.00–100.00)

## 2018-12-21 LAB — VITAMIN B12: Vitamin B-12: 194 pg/mL — ABNORMAL LOW (ref 211–911)

## 2018-12-21 MED ORDER — VITAMIN D (ERGOCALCIFEROL) 1.25 MG (50000 UNIT) PO CAPS: 50000.0000 [IU] | ORAL_CAPSULE | ORAL | 0 refills | Status: DC

## 2018-12-21 NOTE — Addendum Note (Signed)
Addended by: Elmer Picker on: 12/21/2018 08:47 AM   Modules accepted: Orders

## 2018-12-21 NOTE — Addendum Note (Signed)
Addended by: Westley Hummer B on: 12/21/2018 05:19 PM   Modules accepted: Orders

## 2018-12-21 NOTE — Patient Instructions (Signed)
-Nice meeting you today!!  -Lab work today; will notify you once results are available.  -Tetanus vaccine today.  -Make sure you arrange for eye care.   Preventive Care 70-36 Years Old, Male Preventive care refers to lifestyle choices and visits with your health care provider that can promote health and wellness. This includes:  A yearly physical exam. This is also called an annual well check.  Regular dental and eye exams.  Immunizations.  Screening for certain conditions.  Healthy lifestyle choices, such as eating a healthy diet, getting regular exercise, not using drugs or products that contain nicotine and tobacco, and limiting alcohol use. What can I expect for my preventive care visit? Physical exam Your health care provider will check:  Height and weight. These may be used to calculate body mass index (BMI), which is a measurement that tells if you are at a healthy weight.  Heart rate and blood pressure.  Your skin for abnormal spots. Counseling Your health care provider may ask you questions about:  Alcohol, tobacco, and drug use.  Emotional well-being.  Home and relationship well-being.  Sexual activity.  Eating habits.  Work and work Statistician. What immunizations do I need?  Influenza (flu) vaccine  This is recommended every year. Tetanus, diphtheria, and pertussis (Tdap) vaccine  You may need a Td booster every 10 years. Varicella (chickenpox) vaccine  You may need this vaccine if you have not already been vaccinated. Human papillomavirus (HPV) vaccine  If recommended by your health care provider, you may need three doses over 6 months. Measles, mumps, and rubella (MMR) vaccine  You may need at least one dose of MMR. You may also need a second dose. Meningococcal conjugate (MenACWY) vaccine  One dose is recommended if you are 76-35 years old and a Market researcher living in a residence hall, or if you have one of several medical  conditions. You may also need additional booster doses. Pneumococcal conjugate (PCV13) vaccine  You may need this if you have certain conditions and were not previously vaccinated. Pneumococcal polysaccharide (PPSV23) vaccine  You may need one or two doses if you smoke cigarettes or if you have certain conditions. Hepatitis A vaccine  You may need this if you have certain conditions or if you travel or work in places where you may be exposed to hepatitis A. Hepatitis B vaccine  You may need this if you have certain conditions or if you travel or work in places where you may be exposed to hepatitis B. Haemophilus influenzae type b (Hib) vaccine  You may need this if you have certain risk factors. You may receive vaccines as individual doses or as more than one vaccine together in one shot (combination vaccines). Talk with your health care provider about the risks and benefits of combination vaccines. What tests do I need? Blood tests  Lipid and cholesterol levels. These may be checked every 5 years starting at age 55.  Hepatitis C test.  Hepatitis B test. Screening   Diabetes screening. This is done by checking your blood sugar (glucose) after you have not eaten for a while (fasting).  Sexually transmitted disease (STD) testing. Talk with your health care provider about your test results, treatment options, and if necessary, the need for more tests. Follow these instructions at home: Eating and drinking   Eat a diet that includes fresh fruits and vegetables, whole grains, lean protein, and low-fat dairy products.  Take vitamin and mineral supplements as recommended by your health care  provider.  Do not drink alcohol if your health care provider tells you not to drink.  If you drink alcohol: ? Limit how much you have to 0-2 drinks a day. ? Be aware of how much alcohol is in your drink. In the [place] one drink equals one 12 oz bottle of beer (355 mL), one 5 oz glass of wine  (148 mL), or one 1 oz glass of hard liquor (44 mL). Lifestyle  Take daily care of your teeth and gums.  Stay active. Exercise for at least 30 minutes on 5 or more days each week.  Do not use any products that contain nicotine or tobacco, such as cigarettes, e-cigarettes, and chewing tobacco. If you need help quitting, ask your health care provider.  If you are sexually active, practice safe sex. Use a condom or other form of protection to prevent STIs (sexually transmitted infections). What's next?  Go to your health care provider once a year for a well check visit.  Ask your health care provider how often you should have your eyes and teeth checked.  Stay up to date on all vaccines. This information is not intended to replace advice given to you by your health care provider. Make sure you discuss any questions you have with your health care provider. Document Released: 06/30/2001 Document Revised: 04/28/2018 Document Reviewed: 04/28/2018 Elsevier Patient Education  2020 [employer].

## 2018-12-21 NOTE — Progress Notes (Signed)
New Patient Office Visit     CC/Reason for Visit: Establish care, annual preventive exam Previous PCP: In [place] Last Visit: Unknown  HPI: Richard Campos is a 38 y.o. male who is coming in today for the above mentioned reasons.  He has no past medical history of significance other than occasional GERD, he most recently completed a 14-day treatment of Nexium.  He states in [place] he had an EGD that was unremarkable.  He also has a history of left tennis elbow but has not had surgery.  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  He is a never smoker, drinks alcohol only occasionally.  He is requesting a dermatology referral as he is used to having every 6 month skin checks as has had several moles removed throughout the years, all noncancerous.  He has no drug allergies, takes no current medications.  He has a Pharmacist, community but has never seen an eye doctor, is due for tetanus vaccine.  No history of colon cancer in the family.   Past Medical/Surgical History: Past Medical History:  Diagnosis Date  . GERD (gastroesophageal reflux disease)   . Left tennis elbow     History reviewed. No pertinent surgical history.  Social History:  reports that he has never smoked. He has never used smokeless tobacco. He reports current alcohol use. He reports that he does not use drugs.  Allergies: No Known Allergies  Family History:  Family History  Problem Relation Age of Onset  . CAD Father     No current outpatient medications on file.  Review of Systems:  Constitutional: Denies fever, chills, diaphoresis, appetite change and fatigue.  HEENT: Denies photophobia, eye pain, redness, hearing loss, ear pain, congestion, sore throat, rhinorrhea, sneezing, mouth sores, trouble swallowing, neck pain, neck stiffness and tinnitus.   Respiratory: Denies SOB, DOE, cough, chest tightness,  and wheezing.   Cardiovascular: Denies  chest pain, palpitations and leg swelling.  Gastrointestinal: Denies nausea, vomiting, abdominal pain, diarrhea, constipation, blood in stool and abdominal distention.  Genitourinary: Denies dysuria, urgency, frequency, hematuria, flank pain and difficulty urinating.  Endocrine: Denies: hot or cold intolerance, sweats, changes in hair or nails, polyuria, polydipsia. Musculoskeletal: Denies myalgias, back pain, joint swelling, arthralgias and gait problem.  Skin: Denies pallor, rash and wound.  Neurological: Denies dizziness, seizures, syncope, weakness, light-headedness, numbness and headaches.  Hematological: Denies adenopathy. Easy bruising, personal or family bleeding history  Psychiatric/Behavioral: Denies suicidal ideation, mood changes, confusion, nervousness, sleep disturbance and agitation    Physical Exam: Vitals:   12/21/18 0808  BP: 102/64  Pulse: 64  Temp: 98 F (36.7 C)  TempSrc: Temporal  SpO2: 95%  Weight: 184 lb 14.4 oz (83.9 kg)  Height: 6' 3"  (1.905 m)   Body mass index is 23.11 kg/m.  Constitutional: NAD, calm, comfortable Eyes: PERRL, lids and conjunctivae normal ENMT: Mucous membranes are moist. Tympanic membrane is pearly white, no erythema or bulging on the right, left is obstructed by cerumen Neck: normal, supple, no masses, no thyromegaly Respiratory: clear to auscultation bilaterally, no wheezing, no crackles. Normal respiratory effort. No accessory muscle use.  Cardiovascular: Regular rate and rhythm, no murmurs / rubs / gallops. No extremity edema. 2+ pedal pulses. No carotid bruits.  Abdomen: no tenderness, no masses palpated. No hepatosplenomegaly. Bowel sounds positive.  Musculoskeletal: no clubbing / cyanosis. No joint deformity upper and lower extremities. Good ROM, no contractures. Normal muscle tone.  Skin: no rashes, lesions, ulcers. No induration Neurologic: CN 2-12 grossly intact. Sensation intact, DTR normal. Strength 5/5 in all 4.   Psychiatric: Normal judgment and insight. Alert and oriented x 3. Normal mood.    Impression and Plan:  Encounter for preventive health examination  -He has routine dental care. -Have advised routine eye care. -Will receive tetanus vaccine today, otherwise immunizations are up-to-date. -Screening labs to be performed today. -Have discussed healthy lifestyle in detail. -Start routine colon cancer screening at age 60.  Gastroesophageal reflux disease without esophagitis  -He has recently completed 14 days of Nexium. -If has recurrence of GERD may consider daily PPI therapy, wonder if he was biopsied for H. pylori during his EGD, he will obtain records for [place].  Impacted cerumen of left ear  -Cerumen Desimpaction  Warm water was applied and gentle ear lavage performed on left ear. There were no complications and following the desimpaction the tympanic membranes were visible. Tympanic membranes are intact following the procedure. Auditory canals are normal. The patient reported relief of symptoms after removal of cerumen.  Left tennis elbow -Under the care of orthopedics, has recently been released for exercise again.     Patient Instructions  -Nice meeting you today!!  -Lab work today; will notify you once results are available.  -Tetanus vaccine today.  -Make sure you arrange for eye care.   Preventive Care 36-47 Years Old, Male Preventive care refers to lifestyle choices and visits with your health care provider that can promote health and wellness. This includes:  A yearly physical exam. This is also called an annual well check.  Regular dental and eye exams.  Immunizations.  Screening for certain conditions.  Healthy lifestyle choices, such as eating a healthy diet, getting regular exercise, not using drugs or products that contain nicotine and tobacco, and limiting alcohol use. What can I expect for my preventive care visit? Physical exam Your health care  provider will check:  Height and weight. These may be used to calculate body mass index (BMI), which is a measurement that tells if you are at a healthy weight.  Heart rate and blood pressure.  Your skin for abnormal spots. Counseling Your health care provider may ask you questions about:  Alcohol, tobacco, and drug use.  Emotional well-being.  Home and relationship well-being.  Sexual activity.  Eating habits.  Work and work Statistician. What immunizations do I need?  Influenza (flu) vaccine  This is recommended every year. Tetanus, diphtheria, and pertussis (Tdap) vaccine  You may need a Td booster every 10 years. Varicella (chickenpox) vaccine  You may need this vaccine if you have not already been vaccinated. Human papillomavirus (HPV) vaccine  If recommended by your health care provider, you may need three doses over 6 months. Measles, mumps, and rubella (MMR) vaccine  You may need at least one dose of MMR. You may also need a second dose. Meningococcal conjugate (MenACWY) vaccine  One dose is recommended if you are 52-35 years old and a Market researcher living in a residence hall, or if you have one of several medical conditions. You may also need additional booster doses. Pneumococcal conjugate (PCV13) vaccine  You may need this if you have certain conditions and were not previously vaccinated. Pneumococcal polysaccharide (PPSV23) vaccine  You may need one or two doses if you smoke cigarettes or if you have certain conditions. Hepatitis A vaccine  You may need this if you have certain conditions or if you travel or work  in places where you may be exposed to hepatitis A. Hepatitis B vaccine  You may need this if you have certain conditions or if you travel or work in places where you may be exposed to hepatitis B. Haemophilus influenzae type b (Hib) vaccine  You may need this if you have certain risk factors. You may receive vaccines as  individual doses or as more than one vaccine together in one shot (combination vaccines). Talk with your health care provider about the risks and benefits of combination vaccines. What tests do I need? Blood tests  Lipid and cholesterol levels. These may be checked every 5 years starting at age 65.  Hepatitis C test.  Hepatitis B test. Screening   Diabetes screening. This is done by checking your blood sugar (glucose) after you have not eaten for a while (fasting).  Sexually transmitted disease (STD) testing. Talk with your health care provider about your test results, treatment options, and if necessary, the need for more tests. Follow these instructions at home: Eating and drinking   Eat a diet that includes fresh fruits and vegetables, whole grains, lean protein, and low-fat dairy products.  Take vitamin and mineral supplements as recommended by your health care provider.  Do not drink alcohol if your health care provider tells you not to drink.  If you drink alcohol: ? Limit how much you have to 0-2 drinks a day. ? Be aware of how much alcohol is in your drink. In the [place] one drink equals one 12 oz bottle of beer (355 mL), one 5 oz glass of wine (148 mL), or one 1 oz glass of hard liquor (44 mL). Lifestyle  Take daily care of your teeth and gums.  Stay active. Exercise for at least 30 minutes on 5 or more days each week.  Do not use any products that contain nicotine or tobacco, such as cigarettes, e-cigarettes, and chewing tobacco. If you need help quitting, ask your health care provider.  If you are sexually active, practice safe sex. Use a condom or other form of protection to prevent STIs (sexually transmitted infections). What's next?  Go to your health care provider once a year for a well check visit.  Ask your health care provider how often you should have your eyes and teeth checked.  Stay up to date on all vaccines. This information is not intended to  replace advice given to you by your health care provider. Make sure you discuss any questions you have with your health care provider. Document Released: 06/30/2001 Document Revised: 04/28/2018 Document Reviewed: 04/28/2018 Elsevier Patient Education  2020 [place], [place] [facility]

## 2018-12-28 ENCOUNTER — Ambulatory Visit (INDEPENDENT_AMBULATORY_CARE_PROVIDER_SITE_OTHER): Payer: BC Managed Care – PPO | Admitting: *Deleted

## 2018-12-28 ENCOUNTER — Other Ambulatory Visit: Payer: Self-pay

## 2018-12-28 DIAGNOSIS — E538 Deficiency of other specified B group vitamins: Secondary | ICD-10-CM

## 2018-12-28 MED ORDER — CYANOCOBALAMIN 1000 MCG/ML IJ SOLN
1000.0000 ug | Freq: Once | INTRAMUSCULAR | Status: AC
  Administered 2018-12-28: 1000 ug via INTRAMUSCULAR

## 2018-12-28 NOTE — Progress Notes (Signed)
Per orders of Dr. Hernandez, injection of Cyanocobalamin 1000mcg given by ,  A. Patient tolerated injection well. 

## 2019-01-06 ENCOUNTER — Ambulatory Visit (INDEPENDENT_AMBULATORY_CARE_PROVIDER_SITE_OTHER): Payer: BC Managed Care – PPO | Admitting: *Deleted

## 2019-01-06 ENCOUNTER — Other Ambulatory Visit: Payer: Self-pay

## 2019-01-06 DIAGNOSIS — E538 Deficiency of other specified B group vitamins: Secondary | ICD-10-CM

## 2019-01-06 MED ORDER — CYANOCOBALAMIN 1000 MCG/ML IJ SOLN
1000.0000 ug | Freq: Once | INTRAMUSCULAR | Status: AC
  Administered 2019-01-06: 1000 ug via INTRAMUSCULAR

## 2019-01-06 NOTE — Progress Notes (Signed)
Patient in today for B-12 injection. Injection administered with no reactions

## 2019-01-09 ENCOUNTER — Encounter: Payer: Self-pay | Admitting: Internal Medicine

## 2019-01-13 ENCOUNTER — Ambulatory Visit (INDEPENDENT_AMBULATORY_CARE_PROVIDER_SITE_OTHER): Payer: BC Managed Care – PPO | Admitting: *Deleted

## 2019-01-13 ENCOUNTER — Other Ambulatory Visit: Payer: Self-pay

## 2019-01-13 DIAGNOSIS — E538 Deficiency of other specified B group vitamins: Secondary | ICD-10-CM | POA: Diagnosis not present

## 2019-01-13 MED ORDER — CYANOCOBALAMIN 1000 MCG/ML IJ SOLN
1000.0000 ug | Freq: Once | INTRAMUSCULAR | Status: AC
  Administered 2019-01-13: 1000 ug via INTRAMUSCULAR

## 2019-01-13 NOTE — Progress Notes (Signed)
Patent in office today for B-12 injection. B-12 injection administered with no adverse reaction.

## 2019-01-16 ENCOUNTER — Encounter: Payer: Self-pay | Admitting: Internal Medicine

## 2019-01-16 DIAGNOSIS — D485 Neoplasm of uncertain behavior of skin: Secondary | ICD-10-CM | POA: Diagnosis not present

## 2019-01-16 DIAGNOSIS — Z872 Personal history of diseases of the skin and subcutaneous tissue: Secondary | ICD-10-CM | POA: Diagnosis not present

## 2019-01-16 DIAGNOSIS — L821 Other seborrheic keratosis: Secondary | ICD-10-CM | POA: Diagnosis not present

## 2019-01-16 DIAGNOSIS — D225 Melanocytic nevi of trunk: Secondary | ICD-10-CM | POA: Diagnosis not present

## 2019-01-16 DIAGNOSIS — D1801 Hemangioma of skin and subcutaneous tissue: Secondary | ICD-10-CM | POA: Diagnosis not present

## 2019-01-20 ENCOUNTER — Ambulatory Visit (INDEPENDENT_AMBULATORY_CARE_PROVIDER_SITE_OTHER): Payer: BC Managed Care – PPO | Admitting: *Deleted

## 2019-01-20 ENCOUNTER — Other Ambulatory Visit: Payer: Self-pay

## 2019-01-20 DIAGNOSIS — E538 Deficiency of other specified B group vitamins: Secondary | ICD-10-CM

## 2019-01-20 DIAGNOSIS — Z23 Encounter for immunization: Secondary | ICD-10-CM

## 2019-01-20 MED ORDER — CYANOCOBALAMIN 1000 MCG/ML IJ SOLN
1000.0000 ug | Freq: Once | INTRAMUSCULAR | Status: AC
  Administered 2019-01-20: 15:00:00 1000 ug via INTRAMUSCULAR

## 2019-01-20 NOTE — Progress Notes (Signed)
Per orders of Dr. Jerilee Hoh, injection of B12 and influenza given by Westley Hummer. Patient tolerated injection well.

## 2019-01-27 ENCOUNTER — Ambulatory Visit: Payer: BC Managed Care – PPO

## 2019-02-22 ENCOUNTER — Ambulatory Visit (INDEPENDENT_AMBULATORY_CARE_PROVIDER_SITE_OTHER): Payer: BC Managed Care – PPO | Admitting: *Deleted

## 2019-02-22 ENCOUNTER — Other Ambulatory Visit: Payer: Self-pay

## 2019-02-22 DIAGNOSIS — E538 Deficiency of other specified B group vitamins: Secondary | ICD-10-CM | POA: Diagnosis not present

## 2019-02-22 MED ORDER — CYANOCOBALAMIN 1000 MCG/ML IJ SOLN
1000.0000 ug | Freq: Once | INTRAMUSCULAR | Status: AC
  Administered 2019-02-22: 1000 ug via INTRAMUSCULAR

## 2019-02-22 NOTE — Progress Notes (Signed)
Per orders of Dr. Elease Hashimoto, injection of Cynaocobalamin 1077mcg given by Agnes Lawrence. Patient tolerated injection well.

## 2019-03-08 ENCOUNTER — Other Ambulatory Visit: Payer: Self-pay | Admitting: Internal Medicine

## 2019-03-08 DIAGNOSIS — E559 Vitamin D deficiency, unspecified: Secondary | ICD-10-CM

## 2019-03-24 ENCOUNTER — Ambulatory Visit (INDEPENDENT_AMBULATORY_CARE_PROVIDER_SITE_OTHER): Payer: BC Managed Care – PPO | Admitting: *Deleted

## 2019-03-24 ENCOUNTER — Other Ambulatory Visit: Payer: Self-pay

## 2019-03-24 DIAGNOSIS — E538 Deficiency of other specified B group vitamins: Secondary | ICD-10-CM | POA: Diagnosis not present

## 2019-03-24 MED ORDER — CYANOCOBALAMIN 1000 MCG/ML IJ SOLN
1000.0000 ug | Freq: Once | INTRAMUSCULAR | Status: AC
  Administered 2019-03-24: 1000 ug via INTRAMUSCULAR

## 2019-03-24 NOTE — Progress Notes (Signed)
Patient in office for B-12 injection. B-12 administered with no reaction.  

## 2019-04-18 ENCOUNTER — Telehealth: Payer: Self-pay | Admitting: *Deleted

## 2019-04-18 NOTE — Telephone Encounter (Signed)
Copied from [place] [phone]. Topic: Appointment Scheduling - Scheduling Inquiry for Clinic >> Apr 18, 2019  4:56 PM Alease Frame wrote: Reason for CRM: Patient has an appt Fruday for a B12 shot and will need to resch for tomorrow if possible [phone] . Please advise  Appointment rescheduled.

## 2019-04-19 ENCOUNTER — Other Ambulatory Visit: Payer: Self-pay

## 2019-04-19 ENCOUNTER — Ambulatory Visit (INDEPENDENT_AMBULATORY_CARE_PROVIDER_SITE_OTHER): Payer: BC Managed Care – PPO

## 2019-04-19 DIAGNOSIS — E538 Deficiency of other specified B group vitamins: Secondary | ICD-10-CM | POA: Diagnosis not present

## 2019-04-19 MED ORDER — CYANOCOBALAMIN 1000 MCG/ML IJ SOLN
1000.0000 ug | Freq: Once | INTRAMUSCULAR | Status: AC
  Administered 2019-04-19: 1000 ug via INTRAMUSCULAR

## 2019-04-19 NOTE — Progress Notes (Signed)
Per orders of Dr. Jerilee Hoh, injection of B2 given by Rebecca Eaton. Patient tolerated injection well.

## 2019-04-21 ENCOUNTER — Ambulatory Visit: Payer: BC Managed Care – PPO

## 2019-05-17 ENCOUNTER — Other Ambulatory Visit: Payer: Self-pay

## 2019-05-17 ENCOUNTER — Ambulatory Visit (INDEPENDENT_AMBULATORY_CARE_PROVIDER_SITE_OTHER): Payer: BC Managed Care – PPO

## 2019-05-17 DIAGNOSIS — E538 Deficiency of other specified B group vitamins: Secondary | ICD-10-CM

## 2019-05-17 MED ORDER — CYANOCOBALAMIN 1000 MCG/ML IJ SOLN
1000.0000 ug | Freq: Once | INTRAMUSCULAR | Status: AC
  Administered 2019-05-17: 10:00:00 1000 ug via INTRAMUSCULAR

## 2019-05-17 NOTE — Progress Notes (Signed)
Per orders of , injection of B12 given in Right deltoid by Allesha Aronoff R Lachlyn Vanderstelt. Patient tolerated injection well.  

## 2019-05-17 NOTE — Patient Instructions (Signed)
Health Maintenance Due  Topic Date Due  . HIV Screening  10/16/1997    Depression screen PHQ 2/9 12/21/2018  Decreased Interest 0  Down, Depressed, Hopeless 0  PHQ - 2 Score 0  Altered sleeping 0  Tired, decreased energy 0  Change in appetite 0  Feeling bad or failure about yourself  0  Trouble concentrating 0  Moving slowly or fidgety/restless 0  Suicidal thoughts 0  PHQ-9 Score 0  Difficult doing work/chores Not difficult at all

## 2019-06-16 ENCOUNTER — Ambulatory Visit: Payer: BC Managed Care – PPO

## 2019-06-19 ENCOUNTER — Ambulatory Visit (INDEPENDENT_AMBULATORY_CARE_PROVIDER_SITE_OTHER): Payer: BC Managed Care – PPO | Admitting: *Deleted

## 2019-06-19 ENCOUNTER — Other Ambulatory Visit: Payer: Self-pay

## 2019-06-19 DIAGNOSIS — E538 Deficiency of other specified B group vitamins: Secondary | ICD-10-CM

## 2019-06-19 MED ORDER — CYANOCOBALAMIN 1000 MCG/ML IJ SOLN
1000.0000 ug | Freq: Once | INTRAMUSCULAR | Status: AC
  Administered 2019-06-19: 1000 ug via INTRAMUSCULAR

## 2019-06-19 NOTE — Progress Notes (Signed)
Per orders of Dr. Burchette, injection of Cyanocobalamin 1000mcg given by Heston Widener A. Patient tolerated injection well.  

## 2019-07-01 ENCOUNTER — Ambulatory Visit: Payer: BC Managed Care – PPO

## 2019-07-24 DIAGNOSIS — L905 Scar conditions and fibrosis of skin: Secondary | ICD-10-CM | POA: Diagnosis not present

## 2019-07-24 DIAGNOSIS — D225 Melanocytic nevi of trunk: Secondary | ICD-10-CM | POA: Diagnosis not present

## 2019-07-24 DIAGNOSIS — L821 Other seborrheic keratosis: Secondary | ICD-10-CM | POA: Diagnosis not present

## 2019-07-24 DIAGNOSIS — D1801 Hemangioma of skin and subcutaneous tissue: Secondary | ICD-10-CM | POA: Diagnosis not present

## 2019-11-20 ENCOUNTER — Ambulatory Visit: Payer: BC Managed Care – PPO | Attending: Internal Medicine

## 2019-11-20 DIAGNOSIS — Z20822 Contact with and (suspected) exposure to covid-19: Secondary | ICD-10-CM

## 2019-11-21 LAB — NOVEL CORONAVIRUS, NAA

## 2020-02-01 DIAGNOSIS — L814 Other melanin hyperpigmentation: Secondary | ICD-10-CM | POA: Diagnosis not present

## 2020-02-01 DIAGNOSIS — D225 Melanocytic nevi of trunk: Secondary | ICD-10-CM | POA: Diagnosis not present

## 2020-02-01 DIAGNOSIS — L905 Scar conditions and fibrosis of skin: Secondary | ICD-10-CM | POA: Diagnosis not present

## 2020-02-01 DIAGNOSIS — Z872 Personal history of diseases of the skin and subcutaneous tissue: Secondary | ICD-10-CM | POA: Diagnosis not present

## 2020-02-09 ENCOUNTER — Other Ambulatory Visit: Payer: BC Managed Care – PPO

## 2020-02-09 DIAGNOSIS — Z20822 Contact with and (suspected) exposure to covid-19: Secondary | ICD-10-CM | POA: Diagnosis not present

## 2020-02-11 LAB — NOVEL CORONAVIRUS, NAA: SARS-CoV-2, NAA: NOT DETECTED

## 2020-02-11 LAB — SARS-COV-2, NAA 2 DAY TAT

## 2020-03-06 DIAGNOSIS — Z23 Encounter for immunization: Secondary | ICD-10-CM | POA: Diagnosis not present

## 2020-04-05 DIAGNOSIS — F4320 Adjustment disorder, unspecified: Secondary | ICD-10-CM | POA: Diagnosis not present

## 2020-04-25 DIAGNOSIS — F4320 Adjustment disorder, unspecified: Secondary | ICD-10-CM | POA: Diagnosis not present

## 2020-05-03 DIAGNOSIS — F4320 Adjustment disorder, unspecified: Secondary | ICD-10-CM | POA: Diagnosis not present

## 2020-05-24 DIAGNOSIS — F4320 Adjustment disorder, unspecified: Secondary | ICD-10-CM | POA: Diagnosis not present

## 2020-05-28 IMAGING — MR MR ELBOW*L* W/O CM
5 series · 40 of 40 positions shown · non-contrast
Comparison: None.

CLINICAL DATA: Chronic left elbow pain

EXAM:
MRI OF THE LEFT ELBOW WITHOUT CONTRAST
TECHNIQUE: Multiplanar, multisequence MR imaging of the elbow was performed. No
intravenous contrast was administered.

[Series 7: T1 · coronal · left · 3.0mm · 0.44mm/px · 6 of 21 slices shown (1 of 2)]
[im 1/21]
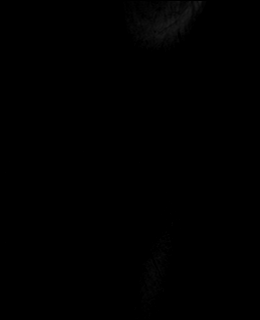
[im 5/21]
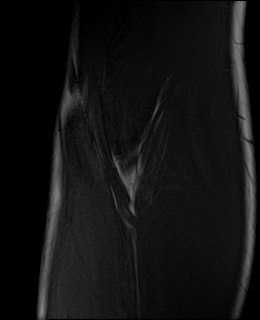
[im 9/21]
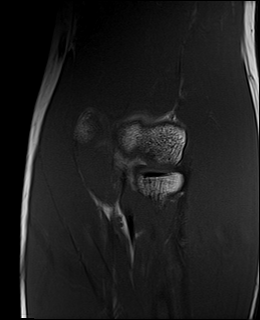
[im 13/21]
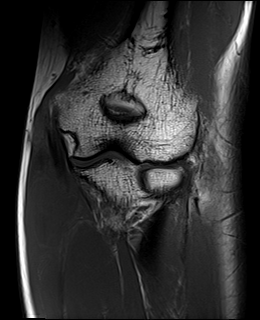
[im 17/21]
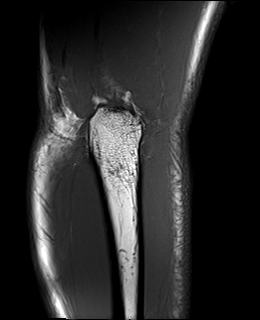
[im 21/21]
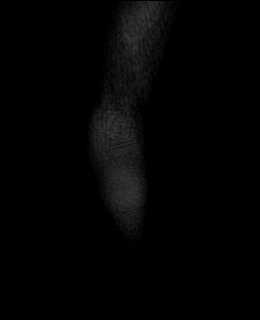

[Series 8: PD fat-sat · sagittal · left · 3.0mm · 0.44mm/px · 7 of 23 slices shown]
[im 1/23]
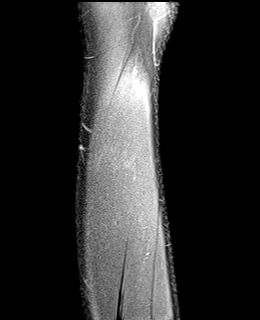
[im 4/23]
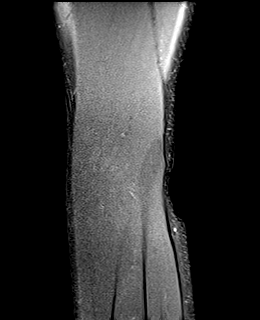
[im 8/23]
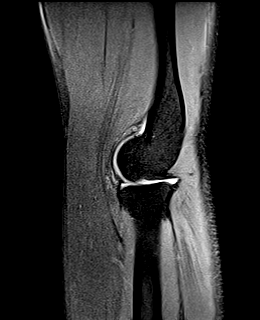
[im 12/23]
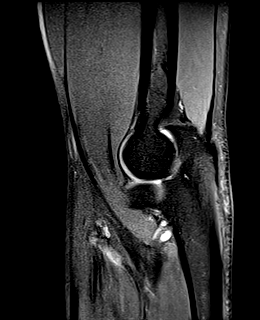
[im 15/23]
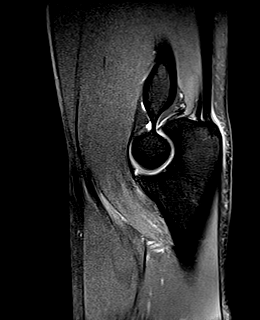
[im 19/23]
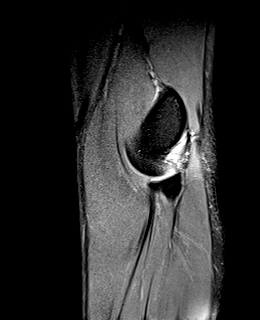
[im 23/23]
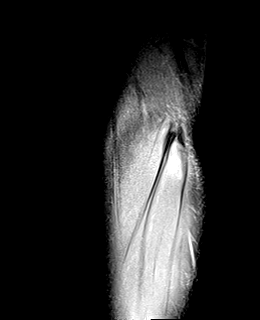

[Series 9: T2 fat-sat · coronal · left · 3.0mm · 0.44mm/px · 7 of 21 slices shown (1 of 2)]
[im 1/21]
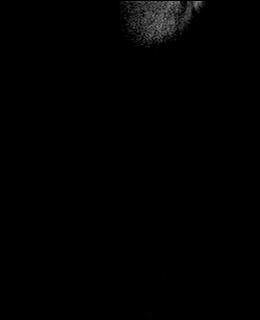
[im 4/21]
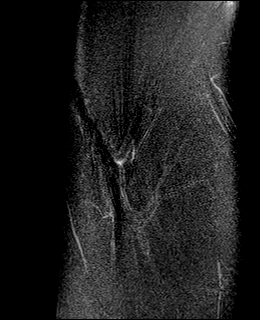
[im 7/21]
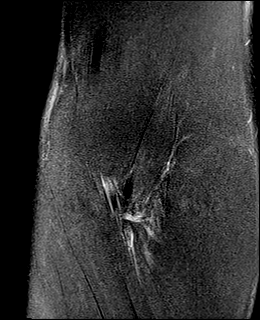
[im 11/21]
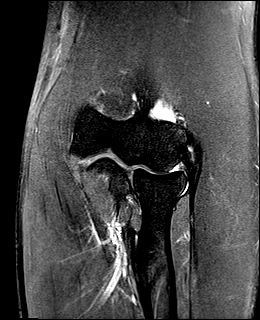
[im 14/21]
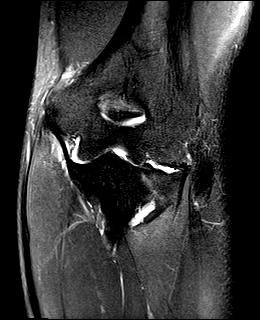
[im 17/21]
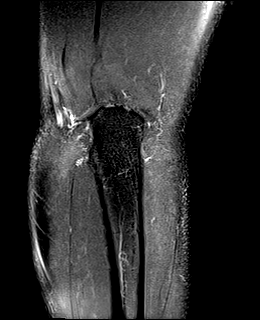
[im 21/21]
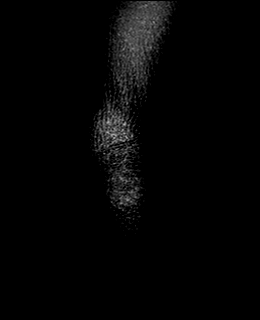

[Series 10: T2 fat-sat · axial · left · 3.0mm · 0.41mm/px · z∈[-7,+105]mm · 10 of 31 slices shown (2 of 2)]
[im 1/31]
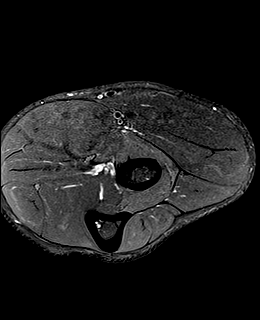
[im 4/31]
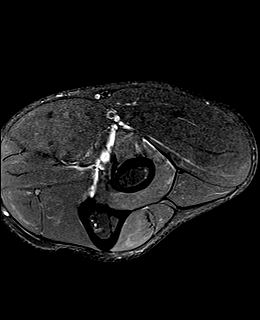
[im 7/31]
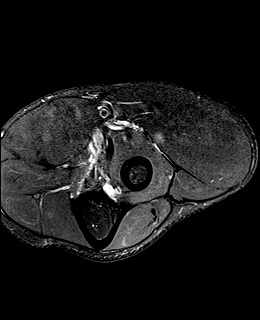
[im 11/31]
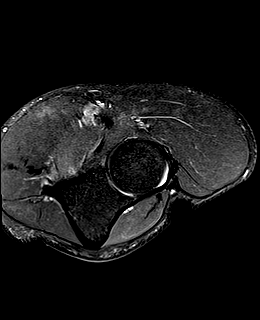
[im 14/31]
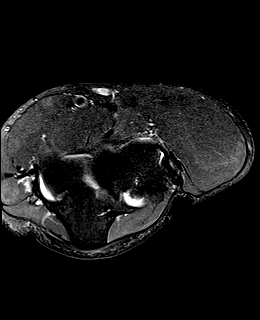
[im 17/31]
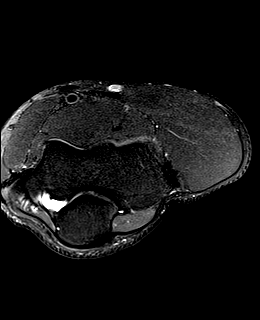
[im 21/31]
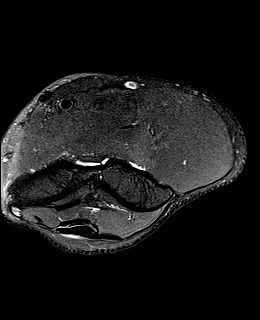
[im 24/31]
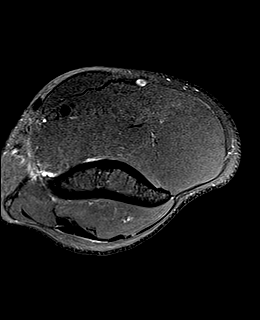
[im 27/31]
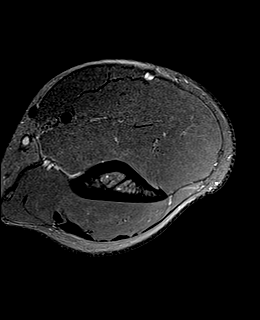
[im 31/31]
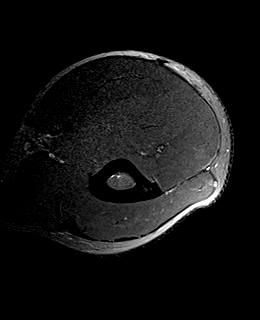

[Series 11: T1 · axial · left · 3.0mm · 0.41mm/px · z∈[-7,+105]mm · 10 of 31 slices shown (2 of 2)]
[im 1/31]
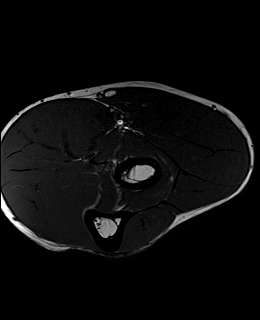
[im 4/31]
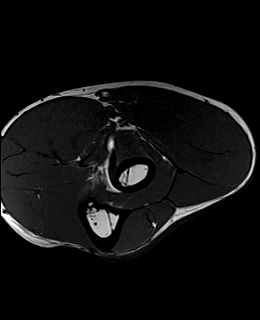
[im 7/31]
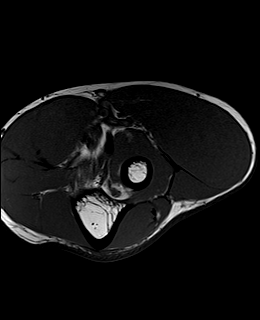
[im 11/31]
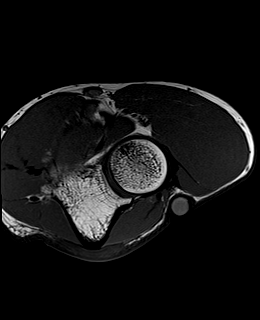
[im 14/31]
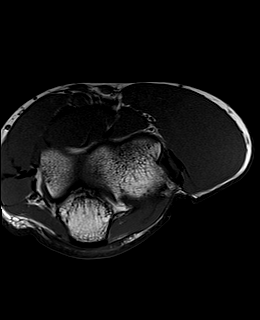
[im 17/31]
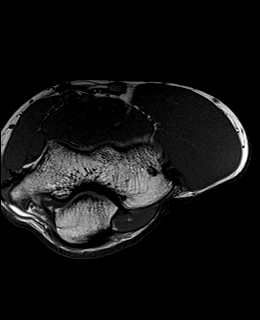
[im 21/31]
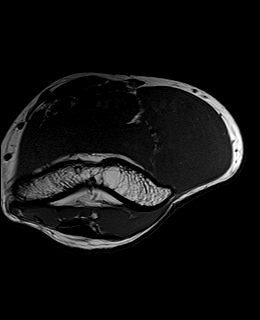
[im 24/31]
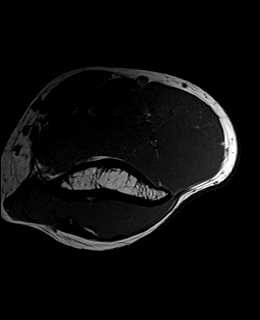
[im 27/31]
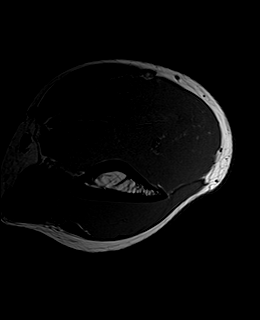
[im 31/31]
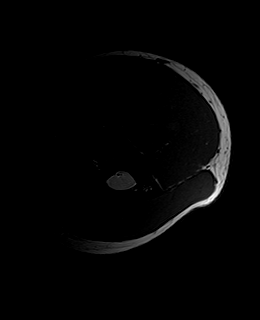

[40 of 40 positions shown; findings below may reference images not displayed]

FINDINGS: TENDONS

Common forearm flexor origin: Intact.

Common forearm extensor origin: Moderate tendinosis of the common
extensor tendon origin with a small interstitial tear.

Biceps: Intact.

Triceps: Intact.

LIGAMENTS

Medial stabilizers: Intact.

Lateral stabilizers:  Intact.

Cartilage: No chondral defect.

Joint: No joint effusion.

Cubital tunnel: Normal.

Bones: No acute osseous abnormality.  No aggressive osseous lesion.
IMPRESSION: 1. Moderate tendinosis of the common extensor tendon origin with a
small interstitial tear.

## 2020-06-07 DIAGNOSIS — F4320 Adjustment disorder, unspecified: Secondary | ICD-10-CM | POA: Diagnosis not present

## 2020-06-21 DIAGNOSIS — F4320 Adjustment disorder, unspecified: Secondary | ICD-10-CM | POA: Diagnosis not present

## 2020-07-05 DIAGNOSIS — F4320 Adjustment disorder, unspecified: Secondary | ICD-10-CM | POA: Diagnosis not present

## 2020-07-25 ENCOUNTER — Encounter: Payer: Self-pay | Admitting: Sports Medicine

## 2020-07-25 ENCOUNTER — Other Ambulatory Visit: Payer: Self-pay

## 2020-07-25 ENCOUNTER — Ambulatory Visit: Payer: BC Managed Care – PPO | Admitting: Sports Medicine

## 2020-07-25 VITALS — BP 104/72 | Ht 75.0 in | Wt 185.0 lb

## 2020-07-25 DIAGNOSIS — M25562 Pain in left knee: Secondary | ICD-10-CM | POA: Diagnosis not present

## 2020-07-25 NOTE — Progress Notes (Signed)
   Subjective:    Patient ID: Richard Campos, male    DOB: 08-19-1980, 40 y.o.   MRN: [id number]  HPI chief complaint: Left knee pain  Richard Campos comes in today complaining of approximately 3 weeks of left knee pain.  He does not recall any specific injury but began to experience pain while playing tennis.  He took a few days off from running and tennis but continue to experience sharp medial knee pain.  Pain was present with walking as well as going up and down stairs.  He was able to exercise on a spin bike without too much difficulty.  He tried some Aleve which was not helpful.  He then tried over-the-counter ibuprofen which helped almost immediately.  His pain has now resolved.  He did not notice any swelling.  No feelings of instability.  No locking or catching.  No prior knee surgeries.  No significant knee injuries in the past.  He is here primarily to discuss what he should do going forward in the form of exercise.  Intermedic history reviewed Medications reviewed Allergies reviewed    Review of Systems    As above Objective:   Physical Exam  Well-developed, well-nourished.  No acute distress.  Left knee: Full range of motion.  No effusion.  No significant patellofemoral crepitus.  Very mild varus.  There is no tenderness to palpation along the medial or lateral joint lines although he does note that his previous pain was along the medial joint line in the area of my palpation.  Negative McMurray's.  Negative Thessaly's.  Knee is stable to valgus and varus stressing.  Negative anterior drawer, negative posterior drawer.  Neurovascularly intact distally.      Assessment & Plan:   Left knee pain likely secondary to medial meniscal injury  Symptoms are consistent with a medial meniscal injury.  The lack of swelling or mechanical symptoms are reassuring at this point.  I would like for him to start doing some isometric quad exercises and continue with nonimpact exercise the rest of this  week.  If still pain-free, he may start running again next week but will wait a couple of weeks before returning to tennis.  I think he should wear a compression sleeve when active.  If his symptoms return then I would consider imaging at that time.  Follow-up for ongoing or recalcitrant issues.

## 2020-07-31 DIAGNOSIS — F4320 Adjustment disorder, unspecified: Secondary | ICD-10-CM | POA: Diagnosis not present

## 2020-08-02 DIAGNOSIS — D225 Melanocytic nevi of trunk: Secondary | ICD-10-CM | POA: Diagnosis not present

## 2020-08-02 DIAGNOSIS — D1801 Hemangioma of skin and subcutaneous tissue: Secondary | ICD-10-CM | POA: Diagnosis not present

## 2020-08-02 DIAGNOSIS — L814 Other melanin hyperpigmentation: Secondary | ICD-10-CM | POA: Diagnosis not present

## 2020-08-02 DIAGNOSIS — L905 Scar conditions and fibrosis of skin: Secondary | ICD-10-CM | POA: Diagnosis not present

## 2020-08-15 ENCOUNTER — Ambulatory Visit: Payer: BC Managed Care – PPO | Attending: Internal Medicine

## 2020-08-15 DIAGNOSIS — Z20822 Contact with and (suspected) exposure to covid-19: Secondary | ICD-10-CM | POA: Diagnosis not present

## 2020-08-16 LAB — NOVEL CORONAVIRUS, NAA: SARS-CoV-2, NAA: NOT DETECTED

## 2020-08-16 LAB — SARS-COV-2, NAA 2 DAY TAT

## 2020-08-19 DIAGNOSIS — F4320 Adjustment disorder, unspecified: Secondary | ICD-10-CM | POA: Diagnosis not present

## 2020-09-24 ENCOUNTER — Encounter: Payer: BC Managed Care – PPO | Admitting: Internal Medicine

## 2020-10-23 ENCOUNTER — Encounter: Payer: BC Managed Care – PPO | Admitting: Internal Medicine

## 2020-11-04 ENCOUNTER — Other Ambulatory Visit: Payer: Self-pay

## 2020-11-05 ENCOUNTER — Encounter: Payer: Self-pay | Admitting: Internal Medicine

## 2020-11-05 ENCOUNTER — Ambulatory Visit (INDEPENDENT_AMBULATORY_CARE_PROVIDER_SITE_OTHER): Payer: BC Managed Care – PPO | Admitting: Internal Medicine

## 2020-11-05 VITALS — BP 110/74 | HR 57 | Temp 98.0°F | Ht 76.5 in | Wt 190.5 lb

## 2020-11-05 DIAGNOSIS — Z0000 Encounter for general adult medical examination without abnormal findings: Secondary | ICD-10-CM

## 2020-11-05 DIAGNOSIS — E559 Vitamin D deficiency, unspecified: Secondary | ICD-10-CM | POA: Diagnosis not present

## 2020-11-05 DIAGNOSIS — E538 Deficiency of other specified B group vitamins: Secondary | ICD-10-CM | POA: Diagnosis not present

## 2020-11-05 DIAGNOSIS — H6122 Impacted cerumen, left ear: Secondary | ICD-10-CM

## 2020-11-05 NOTE — Progress Notes (Signed)
Established Patient Office Visit     This visit occurred during the SARS-CoV-2 public health emergency.  Safety protocols were in place, including screening questions prior to the visit, additional usage of staff PPE, and extensive cleaning of exam room while observing appropriate contact time as indicated for disinfecting solutions.    CC/Reason for Visit: Annual preventive exam  HPI: Richard Campos is a 40 y.o. male who is coming in today for the above mentioned reasons. Past Medical History is significant for: Vitamin D and B12 deficiencies.  He has been doing well and has no acute complaints.  He now has an 54-month-old son.  He sees the dermatologist every 6 months for preventive exams.  He has routine eye and dental care.  He has had 3 COVID vaccines.  Tdap is up-to-date.   Past Medical/Surgical History: Past Medical History:  Diagnosis Date   GERD (gastroesophageal reflux disease)    Left tennis elbow     No past surgical history on file.  Social History:  reports that he has never smoked. He has never used smokeless tobacco. He reports current alcohol use. He reports that he does not use drugs.  Allergies: No Known Allergies  Family History:  Family History  Problem Relation Age of Onset   CAD Father      Current Outpatient Medications:    loratadine (CLARITIN) 10 MG tablet, Take 10 mg by mouth daily., Disp: , Rfl:    OVER THE COUNTER MEDICATION, Vitamin B 6 and 12, Disp: , Rfl:   Review of Systems:  Constitutional: Denies fever, chills, diaphoresis, appetite change and fatigue.  HEENT: Denies photophobia, eye pain, redness, hearing loss, ear pain, congestion, sore throat, rhinorrhea, sneezing, mouth sores, trouble swallowing, neck pain, neck stiffness and tinnitus.   Respiratory: Denies SOB, DOE, cough, chest tightness,  and wheezing.   Cardiovascular: Denies chest pain, palpitations and leg swelling.  Gastrointestinal: Denies nausea, vomiting, abdominal  pain, diarrhea, constipation, blood in stool and abdominal distention.  Genitourinary: Denies dysuria, urgency, frequency, hematuria, flank pain and difficulty urinating.  Endocrine: Denies: hot or cold intolerance, sweats, changes in hair or nails, polyuria, polydipsia. Musculoskeletal: Denies myalgias, back pain, joint swelling, arthralgias and gait problem.  Skin: Denies pallor, rash and wound.  Neurological: Denies dizziness, seizures, syncope, weakness, light-headedness, numbness and headaches.  Hematological: Denies adenopathy. Easy bruising, personal or family bleeding history  Psychiatric/Behavioral: Denies suicidal ideation, mood changes, confusion, nervousness, sleep disturbance and agitation    Physical Exam: Vitals:   11/05/20 1300  BP: 110/74  Pulse: (!) 57  Temp: 98 F (36.7 C)  TempSrc: Oral  SpO2: 99%  Weight: 190 lb 8 oz (86.4 kg)  Height: 6' 4.5" (1.943 m)    Body mass index is 22.89 kg/m.   Constitutional: NAD, calm, comfortable Eyes: PERRL, lids and conjunctivae normal ENMT: Mucous membranes are moist. Posterior pharynx clear of any exudate or lesions. Normal dentition. Tympanic membrane is pearly white, no erythema or bulging on the right, left is obstructed by cerumen. Neck: normal, supple, no masses, no thyromegaly Respiratory: clear to auscultation bilaterally, no wheezing, no crackles. Normal respiratory effort. No accessory muscle use.  Cardiovascular: Regular rate and rhythm, no murmurs / rubs / gallops. No extremity edema. 2+ pedal pulses. No carotid bruits.  Abdomen: no tenderness, no masses palpated. No hepatosplenomegaly. Bowel sounds positive.  Musculoskeletal: no clubbing / cyanosis. No joint deformity upper and lower extremities. Good ROM, no contractures. Normal muscle tone.  Skin: no rashes, lesions,  ulcers. No induration Neurologic: CN 2-12 grossly intact. Sensation intact, DTR normal. Strength 5/5 in all 4.  Psychiatric: Normal judgment and  insight. Alert and oriented x 3. Normal mood.    Impression and Plan:  Encounter for preventive health examination  - Plan: CBC with Differential/Platelet, Comprehensive metabolic panel, Hemoglobin A1c, Lipid panel, TSH -He has routine eye and dental care. -He will return fasting for labs. -All immunizations are up-to-date and age-appropriate. -Healthy lifestyle discussed in detail. -Commence routine colon cancer screening age 68.  Vitamin D deficiency  - Plan: VITAMIN D 25 Hydroxy (Vit-D Deficiency, Fractures)  Vitamin B12 deficiency  - Plan: Vitamin B12  Hearing loss of left ear due to cerumen impaction -Cerumen Desimpaction  After patient consent was obtained, warm water was applied and gentle ear lavage performed on left ear. There were no complications and following the desimpaction the tympanic membranes were visible. Tympanic membranes are intact following the procedure. Auditory canals are normal. The patient reported relief of symptoms after removal of cerumen.    Patient Instructions  -Nice seeing you today!!  -Return fasting for labs.  -Schedule follow up in 1 year or sooner as needed.      Chaya Jan, MD [facility]

## 2020-11-05 NOTE — Patient Instructions (Signed)
-  Nice seeing you today!!  -Return fasting for labs.  -Schedule follow up in 1 year or sooner as needed.   

## 2020-11-12 ENCOUNTER — Other Ambulatory Visit: Payer: BC Managed Care – PPO

## 2020-11-14 ENCOUNTER — Other Ambulatory Visit: Payer: Self-pay

## 2020-11-14 ENCOUNTER — Other Ambulatory Visit (INDEPENDENT_AMBULATORY_CARE_PROVIDER_SITE_OTHER): Payer: BC Managed Care – PPO

## 2020-11-14 DIAGNOSIS — Z0000 Encounter for general adult medical examination without abnormal findings: Secondary | ICD-10-CM

## 2020-11-14 DIAGNOSIS — E559 Vitamin D deficiency, unspecified: Secondary | ICD-10-CM

## 2020-11-14 DIAGNOSIS — E538 Deficiency of other specified B group vitamins: Secondary | ICD-10-CM | POA: Diagnosis not present

## 2020-11-14 LAB — VITAMIN B12: Vitamin B-12: 317 pg/mL (ref 211–911)

## 2020-11-14 LAB — CBC WITH DIFFERENTIAL/PLATELET
Basophils Absolute: 0 10*3/uL (ref 0.0–0.1)
Basophils Relative: 0.9 % (ref 0.0–3.0)
Eosinophils Absolute: 0.1 10*3/uL (ref 0.0–0.7)
Eosinophils Relative: 1.8 % (ref 0.0–5.0)
HCT: 45 % (ref 39.0–52.0)
Hemoglobin: 15.6 g/dL (ref 13.0–17.0)
Lymphocytes Relative: 29.3 % (ref 12.0–46.0)
Lymphs Abs: 1.3 10*3/uL (ref 0.7–4.0)
MCHC: 34.6 g/dL (ref 30.0–36.0)
MCV: 94.2 fl (ref 78.0–100.0)
Monocytes Absolute: 0.4 10*3/uL (ref 0.1–1.0)
Monocytes Relative: 8.5 % (ref 3.0–12.0)
Neutro Abs: 2.7 10*3/uL (ref 1.4–7.7)
Neutrophils Relative %: 59.5 % (ref 43.0–77.0)
Platelets: 245 10*3/uL (ref 150.0–400.0)
RBC: 4.78 Mil/uL (ref 4.22–5.81)
RDW: 12.6 % (ref 11.5–15.5)
WBC: 4.6 10*3/uL (ref 4.0–10.5)

## 2020-11-14 LAB — LIPID PANEL
Cholesterol: 103 mg/dL (ref 0–200)
HDL: 46.4 mg/dL (ref >39.00)
LDL Cholesterol: 51 mg/dL (ref 0–99)
NonHDL: 56.47
Total CHOL/HDL Ratio: 2
Triglycerides: 29 mg/dL (ref 0.0–149.0)
VLDL: 5.8 mg/dL (ref 0.0–40.0)

## 2020-11-14 LAB — COMPREHENSIVE METABOLIC PANEL
ALT: 17 U/L (ref 0–53)
AST: 15 U/L (ref 0–37)
Albumin: 4.6 g/dL (ref 3.5–5.2)
Alkaline Phosphatase: 50 U/L (ref 39–117)
BUN: 19 mg/dL (ref 6–23)
CO2: 29 mEq/L (ref 19–32)
Calcium: 9.6 mg/dL (ref 8.4–10.5)
Chloride: 103 mEq/L (ref 96–112)
Creatinine, Ser: 1.2 mg/dL (ref 0.40–1.50)
GFR: 76.95 mL/min (ref >60.00)
Glucose, Bld: 76 mg/dL (ref 70–99)
Potassium: 4.3 mEq/L (ref 3.5–5.1)
Sodium: 139 mEq/L (ref 135–145)
Total Bilirubin: 1 mg/dL (ref 0.2–1.2)
Total Protein: 6.9 g/dL (ref 6.0–8.3)

## 2020-11-14 LAB — TSH: TSH: 1.86 u[IU]/mL (ref 0.35–5.50)

## 2020-11-14 LAB — HEMOGLOBIN A1C: Hgb A1c MFr Bld: 5.4 % (ref 4.6–6.5)

## 2020-11-14 LAB — VITAMIN D 25 HYDROXY (VIT D DEFICIENCY, FRACTURES): VITD: 21.64 ng/mL — ABNORMAL LOW (ref 30.00–100.00)

## 2020-11-19 ENCOUNTER — Other Ambulatory Visit: Payer: Self-pay | Admitting: Internal Medicine

## 2020-11-19 DIAGNOSIS — E559 Vitamin D deficiency, unspecified: Secondary | ICD-10-CM

## 2020-11-19 DIAGNOSIS — E538 Deficiency of other specified B group vitamins: Secondary | ICD-10-CM

## 2020-11-19 MED ORDER — VITAMIN D (ERGOCALCIFEROL) 1.25 MG (50000 UNIT) PO CAPS: 50000.0000 [IU] | ORAL_CAPSULE | ORAL | 0 refills | Status: AC

## 2020-11-29 ENCOUNTER — Encounter: Payer: BC Managed Care – PPO | Admitting: Internal Medicine

## 2021-02-20 DIAGNOSIS — F4322 Adjustment disorder with anxiety: Secondary | ICD-10-CM | POA: Diagnosis not present

## 2021-03-03 DIAGNOSIS — F4322 Adjustment disorder with anxiety: Secondary | ICD-10-CM | POA: Diagnosis not present

## 2021-03-20 DIAGNOSIS — F4322 Adjustment disorder with anxiety: Secondary | ICD-10-CM | POA: Diagnosis not present

## 2021-04-03 DIAGNOSIS — F4322 Adjustment disorder with anxiety: Secondary | ICD-10-CM | POA: Diagnosis not present

## 2021-04-21 DIAGNOSIS — L814 Other melanin hyperpigmentation: Secondary | ICD-10-CM | POA: Diagnosis not present

## 2021-04-21 DIAGNOSIS — L821 Other seborrheic keratosis: Secondary | ICD-10-CM | POA: Diagnosis not present

## 2021-04-21 DIAGNOSIS — D225 Melanocytic nevi of trunk: Secondary | ICD-10-CM | POA: Diagnosis not present

## 2021-04-21 DIAGNOSIS — Z872 Personal history of diseases of the skin and subcutaneous tissue: Secondary | ICD-10-CM | POA: Diagnosis not present

## 2021-04-21 DIAGNOSIS — D485 Neoplasm of uncertain behavior of skin: Secondary | ICD-10-CM | POA: Diagnosis not present

## 2021-04-25 DIAGNOSIS — F4322 Adjustment disorder with anxiety: Secondary | ICD-10-CM | POA: Diagnosis not present

## 2021-05-01 DIAGNOSIS — F4322 Adjustment disorder with anxiety: Secondary | ICD-10-CM | POA: Diagnosis not present

## 2021-06-10 DIAGNOSIS — F4322 Adjustment disorder with anxiety: Secondary | ICD-10-CM | POA: Diagnosis not present

## 2021-06-20 DIAGNOSIS — F4322 Adjustment disorder with anxiety: Secondary | ICD-10-CM | POA: Diagnosis not present

## 2021-07-04 DIAGNOSIS — F4322 Adjustment disorder with anxiety: Secondary | ICD-10-CM | POA: Diagnosis not present

## 2021-07-10 DIAGNOSIS — F4322 Adjustment disorder with anxiety: Secondary | ICD-10-CM | POA: Diagnosis not present

## 2021-07-18 DIAGNOSIS — F4322 Adjustment disorder with anxiety: Secondary | ICD-10-CM | POA: Diagnosis not present

## 2021-07-25 DIAGNOSIS — F4322 Adjustment disorder with anxiety: Secondary | ICD-10-CM | POA: Diagnosis not present

## 2021-08-01 DIAGNOSIS — F4322 Adjustment disorder with anxiety: Secondary | ICD-10-CM | POA: Diagnosis not present

## 2021-08-07 DIAGNOSIS — R102 Pelvic and perineal pain: Secondary | ICD-10-CM | POA: Diagnosis not present

## 2021-08-07 DIAGNOSIS — Z3009 Encounter for other general counseling and advice on contraception: Secondary | ICD-10-CM | POA: Diagnosis not present

## 2021-08-18 DIAGNOSIS — F4322 Adjustment disorder with anxiety: Secondary | ICD-10-CM | POA: Diagnosis not present

## 2021-08-21 DIAGNOSIS — Z302 Encounter for sterilization: Secondary | ICD-10-CM | POA: Diagnosis not present

## 2021-09-11 DIAGNOSIS — F4322 Adjustment disorder with anxiety: Secondary | ICD-10-CM | POA: Diagnosis not present

## 2021-10-08 DIAGNOSIS — F4322 Adjustment disorder with anxiety: Secondary | ICD-10-CM | POA: Diagnosis not present

## 2021-10-21 ENCOUNTER — Ambulatory Visit (INDEPENDENT_AMBULATORY_CARE_PROVIDER_SITE_OTHER): Payer: BC Managed Care – PPO | Admitting: Internal Medicine

## 2021-10-21 ENCOUNTER — Encounter: Payer: Self-pay | Admitting: Internal Medicine

## 2021-10-21 VITALS — BP 110/72 | HR 67 | Temp 97.9°F | Ht 76.75 in | Wt 197.1 lb

## 2021-10-21 DIAGNOSIS — E538 Deficiency of other specified B group vitamins: Secondary | ICD-10-CM | POA: Diagnosis not present

## 2021-10-21 DIAGNOSIS — E559 Vitamin D deficiency, unspecified: Secondary | ICD-10-CM | POA: Diagnosis not present

## 2021-10-21 DIAGNOSIS — Z0000 Encounter for general adult medical examination without abnormal findings: Secondary | ICD-10-CM | POA: Diagnosis not present

## 2021-10-21 DIAGNOSIS — K219 Gastro-esophageal reflux disease without esophagitis: Secondary | ICD-10-CM

## 2021-10-21 LAB — CBC WITH DIFFERENTIAL/PLATELET
Basophils Absolute: 0 10*3/uL (ref 0.0–0.1)
Basophils Relative: 0.8 % (ref 0.0–3.0)
Eosinophils Absolute: 0.1 10*3/uL (ref 0.0–0.7)
Eosinophils Relative: 1.7 % (ref 0.0–5.0)
HCT: 44.5 % (ref 39.0–52.0)
Hemoglobin: 15.2 g/dL (ref 13.0–17.0)
Lymphocytes Relative: 23.1 % (ref 12.0–46.0)
Lymphs Abs: 1.2 10*3/uL (ref 0.7–4.0)
MCHC: 34.3 g/dL (ref 30.0–36.0)
MCV: 92.2 fl (ref 78.0–100.0)
Monocytes Absolute: 0.5 10*3/uL (ref 0.1–1.0)
Monocytes Relative: 8.9 % (ref 3.0–12.0)
Neutro Abs: 3.4 10*3/uL (ref 1.4–7.7)
Neutrophils Relative %: 65.5 % (ref 43.0–77.0)
Platelets: 316 10*3/uL (ref 150.0–400.0)
RBC: 4.82 Mil/uL (ref 4.22–5.81)
RDW: 12.4 % (ref 11.5–15.5)
WBC: 5.2 10*3/uL (ref 4.0–10.5)

## 2021-10-21 LAB — COMPREHENSIVE METABOLIC PANEL
ALT: 19 U/L (ref 0–53)
AST: 19 U/L (ref 0–37)
Albumin: 4.2 g/dL (ref 3.5–5.2)
Alkaline Phosphatase: 52 U/L (ref 39–117)
BUN: 15 mg/dL (ref 6–23)
CO2: 29 mEq/L (ref 19–32)
Calcium: 9.5 mg/dL (ref 8.4–10.5)
Chloride: 103 mEq/L (ref 96–112)
Creatinine, Ser: 1.14 mg/dL (ref 0.40–1.50)
GFR: 81.3 mL/min (ref >60.00)
Glucose, Bld: 65 mg/dL — ABNORMAL LOW (ref 70–99)
Potassium: 4.5 mEq/L (ref 3.5–5.1)
Sodium: 140 mEq/L (ref 135–145)
Total Bilirubin: 0.8 mg/dL (ref 0.2–1.2)
Total Protein: 6.9 g/dL (ref 6.0–8.3)

## 2021-10-21 LAB — LIPID PANEL
Cholesterol: 105 mg/dL (ref 0–200)
HDL: 44.8 mg/dL (ref >39.00)
LDL Cholesterol: 53 mg/dL (ref 0–99)
NonHDL: 59.97
Total CHOL/HDL Ratio: 2
Triglycerides: 36 mg/dL (ref 0.0–149.0)
VLDL: 7.2 mg/dL (ref 0.0–40.0)

## 2021-10-21 LAB — LDL CHOLESTEROL, DIRECT: Direct LDL: 60 mg/dL

## 2021-10-21 LAB — VITAMIN D 25 HYDROXY (VIT D DEFICIENCY, FRACTURES): VITD: 69.56 ng/mL (ref 30.00–100.00)

## 2021-10-21 LAB — VITAMIN B12: Vitamin B-12: 761 pg/mL (ref 211–911)

## 2021-10-21 LAB — HEMOGLOBIN A1C: Hgb A1c MFr Bld: 5.5 % (ref 4.6–6.5)

## 2021-10-21 LAB — TSH: TSH: 1.26 u[IU]/mL (ref 0.35–5.50)

## 2021-10-21 NOTE — Progress Notes (Signed)
Established Patient Office Visit     CC/Reason for Visit: Annual preventive exam  HPI: Richard Campos is a 41 y.o. male who is coming in today for the above mentioned reasons. Past Medical History is significant for: Vitamin D and B12 deficiencies.  He has been feeling well and has no acute health concerns or complaints.  He has a new job and now has a 14-month-old daughter in addition to a 5-1/2-year-old son so he is expectedly fatigued.  He has routine eye and dental care.  He exercises 5 to 6 days a week.  There is no family history of colon or prostate cancer.  He sees a Armed forces operational officer annually.  He is overdue for a bivalent COVID-vaccine.   Past Medical/Surgical History: Past Medical History:  Diagnosis Date   GERD (gastroesophageal reflux disease)    Left tennis elbow     No past surgical history on file.  Social History:  reports that he has never smoked. He has never used smokeless tobacco. He reports current alcohol use. He reports that he does not use drugs.  Allergies: No Known Allergies  Family History:  Family History  Problem Relation Age of Onset   CAD Father      Current Outpatient Medications:    cholecalciferol (VITAMIN D3) 25 MCG (1000 UNIT) tablet, Take 1,000 Units by mouth daily., Disp: , Rfl:    loratadine (CLARITIN) 10 MG tablet, Take 10 mg by mouth daily., Disp: , Rfl:    OVER THE COUNTER MEDICATION, Vitamin B 6 and 12, Disp: , Rfl:   Review of Systems:  Constitutional: Denies fever, chills, diaphoresis, appetite change. HEENT: Denies photophobia, eye pain, redness, hearing loss, ear pain, congestion, sore throat, rhinorrhea, sneezing, mouth sores, trouble swallowing, neck pain, neck stiffness and tinnitus.   Respiratory: Denies SOB, DOE, cough, chest tightness,  and wheezing.   Cardiovascular: Denies chest pain, palpitations and leg swelling.  Gastrointestinal: Denies nausea, vomiting, abdominal pain, diarrhea, constipation, blood in stool and  abdominal distention.  Genitourinary: Denies dysuria, urgency, frequency, hematuria, flank pain and difficulty urinating.  Endocrine: Denies: hot or cold intolerance, sweats, changes in hair or nails, polyuria, polydipsia. Musculoskeletal: Denies myalgias, back pain, joint swelling, arthralgias and gait problem.  Skin: Denies pallor, rash and wound.  Neurological: Denies dizziness, seizures, syncope, weakness, light-headedness, numbness and headaches.  Hematological: Denies adenopathy. Easy bruising, personal or family bleeding history  Psychiatric/Behavioral: Denies suicidal ideation, mood changes, confusion, nervousness, sleep disturbance and agitation    Physical Exam: Vitals:   10/21/21 0705  BP: 110/72  Pulse: 67  Temp: 97.9 F (36.6 C)  TempSrc: Oral  SpO2: 98%  Weight: 197 lb 1.6 oz (89.4 kg)  Height: 6' 4.75" (1.949 m)    Body mass index is 23.53 kg/m.   Constitutional: NAD, calm, comfortable Eyes: PERRL, lids and conjunctivae normal ENMT: Mucous membranes are moist. Posterior pharynx clear of any exudate or lesions. Normal dentition. Tympanic membrane is pearly white, no erythema or bulging. Neck: normal, supple, no masses, no thyromegaly Respiratory: clear to auscultation bilaterally, no wheezing, no crackles. Normal respiratory effort. No accessory muscle use.  Cardiovascular: Regular rate and rhythm, no murmurs / rubs / gallops. No extremity edema. 2+ pedal pulses. No carotid bruits.  Abdomen: no tenderness, no masses palpated. No hepatosplenomegaly. Bowel sounds positive.  Musculoskeletal: no clubbing / cyanosis. No joint deformity upper and lower extremities. Good ROM, no contractures. Normal muscle tone.  Skin: no rashes, lesions, ulcers. No induration Neurologic: CN 2-12 grossly  intact. Sensation intact, DTR normal. Strength 5/5 in all 4.  Psychiatric: Normal judgment and insight. Alert and oriented x 3. Normal mood.    Flowsheet Row Office Visit from 10/21/2021  in [facility] [place]  PHQ-9 Total Score 1        Impression and Plan:  Encounter for preventive health examination  - Plan: CBC with Differential/Platelet, Comprehensive metabolic panel, Hemoglobin A1c, Lipid panel, TSH, LDL Cholesterol, Direct -Recommend routine eye and dental care. -Immunizations: Advised to get bivalent COVID-vaccine -Healthy lifestyle discussed in detail. -Labs to be updated today. -Colon cancer screening: Commence age 83 -Breast cancer screening: Not applicable -Cervical cancer screening: Not applicable -Lung cancer screening: Not applicable -Prostate cancer screening: Commence age 65 -DEXA: Not applicable  Vitamin B12 deficiency  - Plan: Vitamin B12  Vitamin D deficiency  - Plan: VITAMIN D 25 Hydroxy (Vit-D Deficiency, Fractures)  Gastroesophageal reflux disease without esophagitis -He manages this with OTC Tums and omeprazole.     Chaya Jan, MD [facility] at [facility]

## 2021-11-06 ENCOUNTER — Encounter: Payer: BC Managed Care – PPO | Admitting: Internal Medicine

## 2021-11-12 DIAGNOSIS — F4322 Adjustment disorder with anxiety: Secondary | ICD-10-CM | POA: Diagnosis not present

## 2021-12-18 DIAGNOSIS — F4322 Adjustment disorder with anxiety: Secondary | ICD-10-CM | POA: Diagnosis not present

## 2022-01-01 DIAGNOSIS — F4322 Adjustment disorder with anxiety: Secondary | ICD-10-CM | POA: Diagnosis not present

## 2022-01-21 DIAGNOSIS — F4322 Adjustment disorder with anxiety: Secondary | ICD-10-CM | POA: Diagnosis not present

## 2022-02-11 DIAGNOSIS — F4322 Adjustment disorder with anxiety: Secondary | ICD-10-CM | POA: Diagnosis not present

## 2022-03-11 DIAGNOSIS — F4322 Adjustment disorder with anxiety: Secondary | ICD-10-CM | POA: Diagnosis not present

## 2022-03-25 DIAGNOSIS — F4322 Adjustment disorder with anxiety: Secondary | ICD-10-CM | POA: Diagnosis not present

## 2022-04-07 DIAGNOSIS — F4322 Adjustment disorder with anxiety: Secondary | ICD-10-CM | POA: Diagnosis not present

## 2022-04-21 DIAGNOSIS — L814 Other melanin hyperpigmentation: Secondary | ICD-10-CM | POA: Diagnosis not present

## 2022-04-21 DIAGNOSIS — Z872 Personal history of diseases of the skin and subcutaneous tissue: Secondary | ICD-10-CM | POA: Diagnosis not present

## 2022-04-21 DIAGNOSIS — D225 Melanocytic nevi of trunk: Secondary | ICD-10-CM | POA: Diagnosis not present

## 2022-04-21 DIAGNOSIS — L821 Other seborrheic keratosis: Secondary | ICD-10-CM | POA: Diagnosis not present

## 2022-05-01 DIAGNOSIS — F4322 Adjustment disorder with anxiety: Secondary | ICD-10-CM | POA: Diagnosis not present

## 2022-05-25 DIAGNOSIS — F4322 Adjustment disorder with anxiety: Secondary | ICD-10-CM | POA: Diagnosis not present

## 2022-06-16 DIAGNOSIS — F4322 Adjustment disorder with anxiety: Secondary | ICD-10-CM | POA: Diagnosis not present

## 2022-06-26 DIAGNOSIS — F4322 Adjustment disorder with anxiety: Secondary | ICD-10-CM | POA: Diagnosis not present

## 2022-07-10 DIAGNOSIS — F4322 Adjustment disorder with anxiety: Secondary | ICD-10-CM | POA: Diagnosis not present

## 2022-07-17 DIAGNOSIS — F4322 Adjustment disorder with anxiety: Secondary | ICD-10-CM | POA: Diagnosis not present

## 2022-08-03 DIAGNOSIS — F4322 Adjustment disorder with anxiety: Secondary | ICD-10-CM | POA: Diagnosis not present

## 2022-08-24 DIAGNOSIS — F4322 Adjustment disorder with anxiety: Secondary | ICD-10-CM | POA: Diagnosis not present

## 2022-09-11 DIAGNOSIS — F4322 Adjustment disorder with anxiety: Secondary | ICD-10-CM | POA: Diagnosis not present

## 2022-09-25 DIAGNOSIS — F4322 Adjustment disorder with anxiety: Secondary | ICD-10-CM | POA: Diagnosis not present

## 2022-10-23 DIAGNOSIS — F4322 Adjustment disorder with anxiety: Secondary | ICD-10-CM | POA: Diagnosis not present

## 2022-10-26 ENCOUNTER — Encounter: Payer: Self-pay | Admitting: Internal Medicine

## 2022-10-26 ENCOUNTER — Ambulatory Visit (INDEPENDENT_AMBULATORY_CARE_PROVIDER_SITE_OTHER): Payer: BC Managed Care – PPO | Admitting: Internal Medicine

## 2022-10-26 VITALS — BP 120/84 | HR 70 | Temp 97.9°F | Ht 76.25 in | Wt 194.1 lb

## 2022-10-26 DIAGNOSIS — Z0000 Encounter for general adult medical examination without abnormal findings: Secondary | ICD-10-CM

## 2022-10-26 DIAGNOSIS — E538 Deficiency of other specified B group vitamins: Secondary | ICD-10-CM | POA: Diagnosis not present

## 2022-10-26 DIAGNOSIS — Z114 Encounter for screening for human immunodeficiency virus [HIV]: Secondary | ICD-10-CM | POA: Diagnosis not present

## 2022-10-26 DIAGNOSIS — Z1159 Encounter for screening for other viral diseases: Secondary | ICD-10-CM | POA: Diagnosis not present

## 2022-10-26 DIAGNOSIS — E559 Vitamin D deficiency, unspecified: Secondary | ICD-10-CM

## 2022-10-26 LAB — CBC WITH DIFFERENTIAL/PLATELET
Basophils Absolute: 0 10*3/uL (ref 0.0–0.1)
Basophils Relative: 0.7 % (ref 0.0–3.0)
Eosinophils Absolute: 0.1 10*3/uL (ref 0.0–0.7)
Eosinophils Relative: 2.7 % (ref 0.0–5.0)
HCT: 44.9 % (ref 39.0–52.0)
Hemoglobin: 15.1 g/dL (ref 13.0–17.0)
Lymphocytes Relative: 29.4 % (ref 12.0–46.0)
Lymphs Abs: 1.3 10*3/uL (ref 0.7–4.0)
MCHC: 33.7 g/dL (ref 30.0–36.0)
MCV: 94.4 fl (ref 78.0–100.0)
Monocytes Absolute: 0.4 10*3/uL (ref 0.1–1.0)
Monocytes Relative: 9.3 % (ref 3.0–12.0)
Neutro Abs: 2.6 10*3/uL (ref 1.4–7.7)
Neutrophils Relative %: 57.9 % (ref 43.0–77.0)
Platelets: 265 10*3/uL (ref 150.0–400.0)
RBC: 4.76 Mil/uL (ref 4.22–5.81)
RDW: 13.1 % (ref 11.5–15.5)
WBC: 4.5 10*3/uL (ref 4.0–10.5)

## 2022-10-26 LAB — COMPREHENSIVE METABOLIC PANEL
ALT: 17 U/L (ref 0–53)
AST: 20 U/L (ref 0–37)
Albumin: 4.4 g/dL (ref 3.5–5.2)
Alkaline Phosphatase: 51 U/L (ref 39–117)
BUN: 19 mg/dL (ref 6–23)
CO2: 31 mEq/L (ref 19–32)
Calcium: 9.5 mg/dL (ref 8.4–10.5)
Chloride: 103 mEq/L (ref 96–112)
Creatinine, Ser: 1.3 mg/dL (ref 0.40–1.50)
GFR: 68.95 mL/min (ref >60.00)
Glucose, Bld: 81 mg/dL (ref 70–99)
Potassium: 5.2 mEq/L — ABNORMAL HIGH (ref 3.5–5.1)
Sodium: 140 mEq/L (ref 135–145)
Total Bilirubin: 0.8 mg/dL (ref 0.2–1.2)
Total Protein: 6.7 g/dL (ref 6.0–8.3)

## 2022-10-26 LAB — PSA: PSA: 0.73 ng/mL (ref 0.10–4.00)

## 2022-10-26 LAB — LIPID PANEL
Cholesterol: 110 mg/dL (ref 0–200)
HDL: 50.3 mg/dL (ref >39.00)
LDL Cholesterol: 55 mg/dL (ref 0–99)
NonHDL: 60.14
Total CHOL/HDL Ratio: 2
Triglycerides: 27 mg/dL (ref 0.0–149.0)
VLDL: 5.4 mg/dL (ref 0.0–40.0)

## 2022-10-26 LAB — VITAMIN B12: Vitamin B-12: 546 pg/mL (ref 211–911)

## 2022-10-26 LAB — VITAMIN D 25 HYDROXY (VIT D DEFICIENCY, FRACTURES): VITD: 71.43 ng/mL (ref 30.00–100.00)

## 2022-10-26 NOTE — Progress Notes (Signed)
Established Patient Office Visit     CC/Reason for Visit: Annual preventive exam  HPI: Richard Campos is a 42 y.o. male who is coming in today for the above mentioned reasons. Past Medical History is significant for: Vitamin D and B12 deficiencies.  He is doing well and has no acute concerns or complaints.  Has routine eye and dental care.  Immunizations are up-to-date.   Past Medical/Surgical History: Past Medical History:  Diagnosis Date   GERD (gastroesophageal reflux disease)    Left tennis elbow     No past surgical history on file.  Social History:  reports that he has never smoked. He has never used smokeless tobacco. He reports current alcohol use. He reports that he does not use drugs.  Allergies: No Known Allergies  Family History:  Family History  Problem Relation Age of Onset   CAD Father    Heart disease Father    Stroke Maternal Grandmother    Heart disease Paternal Grandfather    Heart disease Paternal Grandmother      Current Outpatient Medications:    cholecalciferol (VITAMIN D3) 25 MCG (1000 UNIT) tablet, Take 1,000 Units by mouth daily., Disp: , Rfl:    loratadine (CLARITIN) 10 MG tablet, Take 10 mg by mouth daily., Disp: , Rfl:    OVER THE COUNTER MEDICATION, Vitamin B 6 and 12, Disp: , Rfl:    tadalafil (CIALIS) 5 MG tablet, , Disp: , Rfl:   Review of Systems:  Negative unless indicated in HPI.   Physical Exam: Vitals:   10/26/22 0808  BP: 120/84  Pulse: 70  Temp: 97.9 F (36.6 C)  TempSrc: Oral  SpO2: 97%  Weight: 194 lb 1.6 oz (88 kg)  Height: 6' 4.25" (1.937 m)    Body mass index is 23.47 kg/m.   Physical Exam Vitals reviewed.  Constitutional:      General: He is not in acute distress.    Appearance: Normal appearance. He is not ill-appearing, toxic-appearing or diaphoretic.  HENT:     Head: Normocephalic.     Right Ear: Tympanic membrane, ear canal and external ear normal. There is no impacted cerumen.     Left  Ear: Tympanic membrane, ear canal and external ear normal. There is no impacted cerumen.     Nose: Nose normal.     Mouth/Throat:     Mouth: Mucous membranes are moist.     Pharynx: Oropharynx is clear. No oropharyngeal exudate or posterior oropharyngeal erythema.  Eyes:     General: No scleral icterus.       Right eye: No discharge.        Left eye: No discharge.     Conjunctiva/sclera: Conjunctivae normal.     Pupils: Pupils are equal, round, and reactive to light.  Neck:     Vascular: No carotid bruit.  Cardiovascular:     Rate and Rhythm: Normal rate and regular rhythm.     Pulses: Normal pulses.     Heart sounds: Normal heart sounds.  Pulmonary:     Effort: Pulmonary effort is normal. No respiratory distress.     Breath sounds: Normal breath sounds.  Abdominal:     General: Abdomen is flat. Bowel sounds are normal.     Palpations: Abdomen is soft.  Musculoskeletal:        General: Normal range of motion.     Cervical back: Normal range of motion.  Skin:    General: Skin is warm and dry.  Neurological:     General: No focal deficit present.     Mental Status: He is alert and oriented to person, place, and time. Mental status is at baseline.  Psychiatric:        Mood and Affect: Mood normal.        Behavior: Behavior normal.        Thought Content: Thought content normal.        Judgment: Judgment normal.     Flowsheet Row Office Visit from 10/26/2022 in [facility] [place]  PHQ-9 Total Score 1        Impression and Plan:  Encounter for preventive health examination -     CBC with Differential/Platelet; Future -     Comprehensive metabolic panel; Future -     Lipid panel; Future -     PSA; Future  Vitamin B12 deficiency -     Vitamin B12; Future  Vitamin D deficiency -     VITAMIN D 25 Hydroxy (Vit-D Deficiency, Fractures); Future  Encounter for hepatitis C screening test for low risk patient -     Hepatitis C antibody;  Future  Encounter for screening for HIV -     HIV Antibody (routine testing w rflx); Future   -Recommend routine eye and dental care. -Healthy lifestyle discussed in detail. -Labs to be updated today. -Prostate cancer screening: PSA today Health Maintenance  Topic Date Due   HIV Screening  Never done   Hepatitis C Screening  Never done   COVID-19 Vaccine (4 - 2023-24 season) 01/16/2022   Flu Shot  12/17/2022   DTaP/Tdap/Td vaccine (3 - Td or Tdap) 12/20/2028   HPV Vaccine  Aged Out        Adah Stoneberg Keylon Aspen, MD [facility] at [facility]

## 2022-10-27 LAB — HEPATITIS C ANTIBODY: Hepatitis C Ab: NONREACTIVE

## 2022-10-27 LAB — HIV ANTIBODY (ROUTINE TESTING W REFLEX): HIV 1&2 Ab, 4th Generation: NONREACTIVE

## 2022-10-28 ENCOUNTER — Other Ambulatory Visit: Payer: Self-pay | Admitting: *Deleted

## 2022-10-28 DIAGNOSIS — E875 Hyperkalemia: Secondary | ICD-10-CM

## 2022-11-13 DIAGNOSIS — F4322 Adjustment disorder with anxiety: Secondary | ICD-10-CM | POA: Diagnosis not present

## 2022-11-18 ENCOUNTER — Other Ambulatory Visit: Payer: BC Managed Care – PPO

## 2022-11-25 ENCOUNTER — Other Ambulatory Visit (INDEPENDENT_AMBULATORY_CARE_PROVIDER_SITE_OTHER): Payer: BC Managed Care – PPO

## 2022-11-25 DIAGNOSIS — E875 Hyperkalemia: Secondary | ICD-10-CM

## 2022-11-25 LAB — BASIC METABOLIC PANEL
BUN: 19 mg/dL (ref 6–23)
CO2: 28 mEq/L (ref 19–32)
Calcium: 9.3 mg/dL (ref 8.4–10.5)
Chloride: 104 mEq/L (ref 96–112)
Creatinine, Ser: 1.3 mg/dL (ref 0.40–1.50)
GFR: 68.91 mL/min (ref >60.00)
Glucose, Bld: 89 mg/dL (ref 70–99)
Potassium: 4.5 mEq/L (ref 3.5–5.1)
Sodium: 139 mEq/L (ref 135–145)

## 2022-11-30 DIAGNOSIS — F4322 Adjustment disorder with anxiety: Secondary | ICD-10-CM | POA: Diagnosis not present

## 2022-12-07 DIAGNOSIS — N529 Male erectile dysfunction, unspecified: Secondary | ICD-10-CM | POA: Diagnosis not present

## 2022-12-17 DIAGNOSIS — F4322 Adjustment disorder with anxiety: Secondary | ICD-10-CM | POA: Diagnosis not present

## 2022-12-29 DIAGNOSIS — F4322 Adjustment disorder with anxiety: Secondary | ICD-10-CM | POA: Diagnosis not present

## 2023-01-15 DIAGNOSIS — F4322 Adjustment disorder with anxiety: Secondary | ICD-10-CM | POA: Diagnosis not present

## 2023-02-05 DIAGNOSIS — F4322 Adjustment disorder with anxiety: Secondary | ICD-10-CM | POA: Diagnosis not present

## 2023-02-10 DIAGNOSIS — D485 Neoplasm of uncertain behavior of skin: Secondary | ICD-10-CM | POA: Diagnosis not present

## 2023-02-10 DIAGNOSIS — C4441 Basal cell carcinoma of skin of scalp and neck: Secondary | ICD-10-CM | POA: Diagnosis not present

## 2023-02-22 DIAGNOSIS — F4322 Adjustment disorder with anxiety: Secondary | ICD-10-CM | POA: Diagnosis not present

## 2023-03-12 DIAGNOSIS — F4322 Adjustment disorder with anxiety: Secondary | ICD-10-CM | POA: Diagnosis not present

## 2023-03-26 DIAGNOSIS — F4322 Adjustment disorder with anxiety: Secondary | ICD-10-CM | POA: Diagnosis not present

## 2023-04-06 DIAGNOSIS — F4322 Adjustment disorder with anxiety: Secondary | ICD-10-CM | POA: Diagnosis not present

## 2023-04-19 DIAGNOSIS — F4322 Adjustment disorder with anxiety: Secondary | ICD-10-CM | POA: Diagnosis not present

## 2023-04-28 DIAGNOSIS — L814 Other melanin hyperpigmentation: Secondary | ICD-10-CM | POA: Diagnosis not present

## 2023-04-28 DIAGNOSIS — D225 Melanocytic nevi of trunk: Secondary | ICD-10-CM | POA: Diagnosis not present

## 2023-04-28 DIAGNOSIS — L821 Other seborrheic keratosis: Secondary | ICD-10-CM | POA: Diagnosis not present

## 2023-04-28 DIAGNOSIS — Z08 Encounter for follow-up examination after completed treatment for malignant neoplasm: Secondary | ICD-10-CM | POA: Diagnosis not present

## 2023-05-06 DIAGNOSIS — F4322 Adjustment disorder with anxiety: Secondary | ICD-10-CM | POA: Diagnosis not present

## 2023-05-28 DIAGNOSIS — F4322 Adjustment disorder with anxiety: Secondary | ICD-10-CM | POA: Diagnosis not present

## 2023-06-07 DIAGNOSIS — F4322 Adjustment disorder with anxiety: Secondary | ICD-10-CM | POA: Diagnosis not present

## 2023-06-22 DIAGNOSIS — F4322 Adjustment disorder with anxiety: Secondary | ICD-10-CM | POA: Diagnosis not present

## 2023-06-30 DIAGNOSIS — Z86007 Personal history of in-situ neoplasm of skin: Secondary | ICD-10-CM | POA: Diagnosis not present

## 2023-06-30 DIAGNOSIS — Z08 Encounter for follow-up examination after completed treatment for malignant neoplasm: Secondary | ICD-10-CM | POA: Diagnosis not present

## 2023-06-30 DIAGNOSIS — Z09 Encounter for follow-up examination after completed treatment for conditions other than malignant neoplasm: Secondary | ICD-10-CM | POA: Diagnosis not present

## 2023-06-30 DIAGNOSIS — L905 Scar conditions and fibrosis of skin: Secondary | ICD-10-CM | POA: Diagnosis not present

## 2023-07-13 DIAGNOSIS — F4322 Adjustment disorder with anxiety: Secondary | ICD-10-CM | POA: Diagnosis not present

## 2023-08-19 DIAGNOSIS — F4322 Adjustment disorder with anxiety: Secondary | ICD-10-CM | POA: Diagnosis not present

## 2023-09-01 DIAGNOSIS — F4322 Adjustment disorder with anxiety: Secondary | ICD-10-CM | POA: Diagnosis not present

## 2023-09-14 DIAGNOSIS — F4322 Adjustment disorder with anxiety: Secondary | ICD-10-CM | POA: Diagnosis not present

## 2023-09-29 DIAGNOSIS — F4322 Adjustment disorder with anxiety: Secondary | ICD-10-CM | POA: Diagnosis not present

## 2023-10-12 DIAGNOSIS — F4322 Adjustment disorder with anxiety: Secondary | ICD-10-CM | POA: Diagnosis not present

## 2023-10-27 ENCOUNTER — Encounter: Payer: BC Managed Care – PPO | Admitting: Internal Medicine

## 2023-10-29 DIAGNOSIS — F4322 Adjustment disorder with anxiety: Secondary | ICD-10-CM | POA: Diagnosis not present

## 2023-11-08 ENCOUNTER — Encounter: Payer: Self-pay | Admitting: Internal Medicine

## 2023-11-08 ENCOUNTER — Ambulatory Visit: Payer: Self-pay | Admitting: Internal Medicine

## 2023-11-08 ENCOUNTER — Ambulatory Visit (INDEPENDENT_AMBULATORY_CARE_PROVIDER_SITE_OTHER): Payer: BC Managed Care – PPO | Admitting: Internal Medicine

## 2023-11-08 VITALS — BP 102/78 | HR 59 | Temp 97.9°F | Ht 76.5 in | Wt 195.9 lb

## 2023-11-08 DIAGNOSIS — E559 Vitamin D deficiency, unspecified: Secondary | ICD-10-CM | POA: Diagnosis not present

## 2023-11-08 DIAGNOSIS — Z125 Encounter for screening for malignant neoplasm of prostate: Secondary | ICD-10-CM

## 2023-11-08 DIAGNOSIS — E538 Deficiency of other specified B group vitamins: Secondary | ICD-10-CM | POA: Diagnosis not present

## 2023-11-08 DIAGNOSIS — E875 Hyperkalemia: Secondary | ICD-10-CM | POA: Diagnosis not present

## 2023-11-08 DIAGNOSIS — Z0000 Encounter for general adult medical examination without abnormal findings: Secondary | ICD-10-CM | POA: Diagnosis not present

## 2023-11-08 DIAGNOSIS — M7712 Lateral epicondylitis, left elbow: Secondary | ICD-10-CM

## 2023-11-08 LAB — CBC WITH DIFFERENTIAL/PLATELET
Basophils Absolute: 0 10*3/uL (ref 0.0–0.1)
Basophils Relative: 1 % (ref 0.0–3.0)
Eosinophils Absolute: 0.1 10*3/uL (ref 0.0–0.7)
Eosinophils Relative: 2.4 % (ref 0.0–5.0)
HCT: 45.6 % (ref 39.0–52.0)
Hemoglobin: 15.6 g/dL (ref 13.0–17.0)
Lymphocytes Relative: 29 % (ref 12.0–46.0)
Lymphs Abs: 1.2 10*3/uL (ref 0.7–4.0)
MCHC: 34.2 g/dL (ref 30.0–36.0)
MCV: 92.7 fl (ref 78.0–100.0)
Monocytes Absolute: 0.4 10*3/uL (ref 0.1–1.0)
Monocytes Relative: 9.7 % (ref 3.0–12.0)
Neutro Abs: 2.3 10*3/uL (ref 1.4–7.7)
Neutrophils Relative %: 57.9 % (ref 43.0–77.0)
Platelets: 257 10*3/uL (ref 150.0–400.0)
RBC: 4.92 Mil/uL (ref 4.22–5.81)
RDW: 12.7 % (ref 11.5–15.5)
WBC: 4 10*3/uL (ref 4.0–10.5)

## 2023-11-08 LAB — VITAMIN D 25 HYDROXY (VIT D DEFICIENCY, FRACTURES): VITD: 62.07 ng/mL (ref 30.00–100.00)

## 2023-11-08 LAB — COMPREHENSIVE METABOLIC PANEL WITH GFR
ALT: 20 U/L (ref 0–53)
AST: 20 U/L (ref 0–37)
Albumin: 4.5 g/dL (ref 3.5–5.2)
Alkaline Phosphatase: 48 U/L (ref 39–117)
BUN: 19 mg/dL (ref 6–23)
CO2: 29 meq/L (ref 19–32)
Calcium: 9.5 mg/dL (ref 8.4–10.5)
Chloride: 104 meq/L (ref 96–112)
Creatinine, Ser: 1.28 mg/dL (ref 0.40–1.50)
GFR: 69.74 mL/min (ref >60.00)
Glucose, Bld: 90 mg/dL (ref 70–99)
Potassium: 4.7 meq/L (ref 3.5–5.1)
Sodium: 139 meq/L (ref 135–145)
Total Bilirubin: 0.7 mg/dL (ref 0.2–1.2)
Total Protein: 7 g/dL (ref 6.0–8.3)

## 2023-11-08 LAB — PSA: PSA: 0.69 ng/mL (ref 0.10–4.00)

## 2023-11-08 LAB — LIPID PANEL
Cholesterol: 120 mg/dL (ref 0–200)
HDL: 48.5 mg/dL (ref >39.00)
LDL Cholesterol: 66 mg/dL (ref 0–99)
NonHDL: 71.07
Total CHOL/HDL Ratio: 2
Triglycerides: 27 mg/dL (ref 0.0–149.0)
VLDL: 5.4 mg/dL (ref 0.0–40.0)

## 2023-11-08 LAB — TSH: TSH: 0.92 u[IU]/mL (ref 0.35–5.50)

## 2023-11-08 LAB — VITAMIN B12: Vitamin B-12: 548 pg/mL (ref 211–911)

## 2023-11-08 NOTE — Progress Notes (Signed)
 Established Patient Office Visit     CC/Reason for Visit: Annual preventive exam  HPI: Richard Campos is a 42 y.o. male who is coming in today for the above mentioned reasons. Past Medical History is significant for: Vitamin D  and B12 deficiencies.  Has been feeling well.  He has again had a flareup of his left tennis elbow.  Will be scheduling a follow-up with orthopedics.  Has routine eye and dental care.   Past Medical/Surgical History: Past Medical History:  Diagnosis Date   GERD (gastroesophageal reflux disease)    Left tennis elbow     History reviewed. No pertinent surgical history.  Social History:  reports that he has never smoked. He has never used smokeless tobacco. He reports current alcohol use. He reports that he does not use drugs.  Allergies: No Known Allergies  Family History:  Family History  Problem Relation Age of Onset   CAD Father    Heart disease Father    Stroke Maternal Grandmother    Heart disease Paternal Grandfather    Heart disease Paternal Grandmother    Diabetes Paternal Grandmother      Current Outpatient Medications:    cholecalciferol (VITAMIN D3) 25 MCG (1000 UNIT) tablet, Take 1,000 Units by mouth daily., Disp: , Rfl:    loratadine (CLARITIN) 10 MG tablet, Take 10 mg by mouth daily., Disp: , Rfl:    OVER THE COUNTER MEDICATION, Vitamin B 6 and 12, Disp: , Rfl:    tadalafil (CIALIS) 5 MG tablet, , Disp: , Rfl:   Review of Systems:  Negative unless indicated in HPI.   Physical Exam: Vitals:   11/08/23 0708  BP: 102/78  Pulse: (!) 59  Temp: 97.9 F (36.6 C)  TempSrc: Oral  SpO2: 97%  Weight: 195 lb 14.4 oz (88.9 kg)  Height: 6' 4.5 (1.943 m)    Body mass index is 23.54 kg/m.   Physical Exam Vitals reviewed.  Constitutional:      General: He is not in acute distress.    Appearance: Normal appearance. He is not ill-appearing, toxic-appearing or diaphoretic.  HENT:     Head: Normocephalic.     Right Ear:  Tympanic membrane, ear canal and external ear normal. There is no impacted cerumen.     Left Ear: Tympanic membrane, ear canal and external ear normal. There is no impacted cerumen.     Nose: Nose normal.     Mouth/Throat:     Mouth: Mucous membranes are moist.     Pharynx: Oropharynx is clear. No oropharyngeal exudate or posterior oropharyngeal erythema.   Eyes:     General: No scleral icterus.       Right eye: No discharge.        Left eye: No discharge.     Conjunctiva/sclera: Conjunctivae normal.     Pupils: Pupils are equal, round, and reactive to light.   Neck:     Vascular: No carotid bruit.   Cardiovascular:     Rate and Rhythm: Normal rate and regular rhythm.     Pulses: Normal pulses.     Heart sounds: Normal heart sounds.  Pulmonary:     Effort: Pulmonary effort is normal. No respiratory distress.     Breath sounds: Normal breath sounds.  Abdominal:     General: Abdomen is flat. Bowel sounds are normal.     Palpations: Abdomen is soft.   Musculoskeletal:        General: Normal range of motion.  Cervical back: Normal range of motion.   Skin:    General: Skin is warm and dry.   Neurological:     General: No focal deficit present.     Mental Status: He is alert and oriented to person, place, and time. Mental status is at baseline.   Psychiatric:        Mood and Affect: Mood normal.        Behavior: Behavior normal.        Thought Content: Thought content normal.        Judgment: Judgment normal.      Impression and Plan:  Encounter for preventive health examination -     CBC with Differential/Platelet; Future -     Comprehensive metabolic panel with GFR; Future -     Lipid panel; Future -     PSA; Future -     TSH; Future  Hyperkalemia  Vitamin B12 deficiency -     Vitamin B12; Future  Vitamin D  deficiency -     VITAMIN D  25 Hydroxy (Vit-D Deficiency, Fractures); Future  Left tennis elbow   -Recommend routine eye and dental care. -Healthy  lifestyle discussed in detail. -Labs to be updated today. -Prostate cancer screening: PSA today Health Maintenance  Topic Date Due   HPV Vaccine (1 - 3-dose SCDM series) Never done   COVID-19 Vaccine (4 - 2024-25 season) 01/17/2023   Flu Shot  12/17/2023   DTaP/Tdap/Td vaccine (3 - Td or Tdap) 12/20/2028   Hepatitis C Screening  Completed   HIV Screening  Completed   Meningitis B Vaccine  Aged Out       Arlana Canizales Theophilus Andrews, MD [facility] at [facility]

## 2023-11-12 DIAGNOSIS — F4322 Adjustment disorder with anxiety: Secondary | ICD-10-CM | POA: Diagnosis not present

## 2023-11-24 DIAGNOSIS — F4322 Adjustment disorder with anxiety: Secondary | ICD-10-CM | POA: Diagnosis not present

## 2023-12-10 DIAGNOSIS — F4322 Adjustment disorder with anxiety: Secondary | ICD-10-CM | POA: Diagnosis not present

## 2023-12-24 DIAGNOSIS — F4322 Adjustment disorder with anxiety: Secondary | ICD-10-CM | POA: Diagnosis not present

## 2024-01-07 DIAGNOSIS — F411 Generalized anxiety disorder: Secondary | ICD-10-CM | POA: Diagnosis not present

## 2024-01-20 DIAGNOSIS — F411 Generalized anxiety disorder: Secondary | ICD-10-CM | POA: Diagnosis not present

## 2024-02-03 DIAGNOSIS — F411 Generalized anxiety disorder: Secondary | ICD-10-CM | POA: Diagnosis not present

## 2024-02-23 DIAGNOSIS — F411 Generalized anxiety disorder: Secondary | ICD-10-CM | POA: Diagnosis not present

## 2024-03-09 DIAGNOSIS — F411 Generalized anxiety disorder: Secondary | ICD-10-CM | POA: Diagnosis not present

## 2024-03-24 DIAGNOSIS — F411 Generalized anxiety disorder: Secondary | ICD-10-CM | POA: Diagnosis not present

## 2024-04-17 DIAGNOSIS — F411 Generalized anxiety disorder: Secondary | ICD-10-CM | POA: Diagnosis not present

## 2024-11-20 ENCOUNTER — Encounter: Admitting: Internal Medicine
